# Patient Record
Sex: Female | Born: 1935 | Race: Black or African American | Hispanic: No | Marital: Single | Smoking: Never smoker
Health system: Southern US, Community
[De-identification: ages and names within clinical notes are randomized; demographics above are authoritative.]

## PROBLEM LIST (undated history)

## (undated) ENCOUNTER — Emergency Department (HOSPITAL_COMMUNITY): Admission: EM | Payer: Medicare Other

## (undated) DIAGNOSIS — R600 Localized edema: Secondary | ICD-10-CM

## (undated) DIAGNOSIS — I10 Essential (primary) hypertension: Secondary | ICD-10-CM

## (undated) DIAGNOSIS — M1990 Unspecified osteoarthritis, unspecified site: Secondary | ICD-10-CM

## (undated) DIAGNOSIS — I499 Cardiac arrhythmia, unspecified: Secondary | ICD-10-CM

## (undated) DIAGNOSIS — H409 Unspecified glaucoma: Secondary | ICD-10-CM

## (undated) DIAGNOSIS — K279 Peptic ulcer, site unspecified, unspecified as acute or chronic, without hemorrhage or perforation: Secondary | ICD-10-CM

## (undated) DIAGNOSIS — M792 Neuralgia and neuritis, unspecified: Secondary | ICD-10-CM

## (undated) HISTORY — PX: APPENDECTOMY: SHX54

## (undated) HISTORY — DX: Unspecified glaucoma: H40.9

## (undated) HISTORY — DX: Unspecified osteoarthritis, unspecified site: M19.90

## (undated) HISTORY — DX: Localized edema: R60.0

## (undated) HISTORY — PX: VESICOVAGINAL FISTULA CLOSURE W/ TAH: SUR271

## (undated) HISTORY — DX: Neuralgia and neuritis, unspecified: M79.2

## (undated) HISTORY — DX: Peptic ulcer, site unspecified, unspecified as acute or chronic, without hemorrhage or perforation: K27.9

---

## 2001-07-14 ENCOUNTER — Encounter: Admission: RE | Admit: 2001-07-14 | Discharge: 2001-07-14 | Payer: Self-pay | Admitting: Orthopedic Surgery

## 2001-07-14 ENCOUNTER — Encounter: Payer: Self-pay | Admitting: Orthopedic Surgery

## 2001-07-28 ENCOUNTER — Encounter: Admission: RE | Admit: 2001-07-28 | Discharge: 2001-07-28 | Payer: Self-pay | Admitting: Orthopedic Surgery

## 2001-07-28 ENCOUNTER — Encounter: Payer: Self-pay | Admitting: Orthopedic Surgery

## 2001-08-11 ENCOUNTER — Encounter: Admission: RE | Admit: 2001-08-11 | Discharge: 2001-08-11 | Payer: Self-pay | Admitting: Orthopedic Surgery

## 2001-08-11 ENCOUNTER — Encounter: Payer: Self-pay | Admitting: Orthopedic Surgery

## 2002-03-29 ENCOUNTER — Encounter: Admission: RE | Admit: 2002-03-29 | Discharge: 2002-03-29 | Payer: Self-pay | Admitting: Orthopedic Surgery

## 2002-03-29 ENCOUNTER — Encounter: Payer: Self-pay | Admitting: Orthopedic Surgery

## 2002-04-13 ENCOUNTER — Encounter: Payer: Self-pay | Admitting: Orthopedic Surgery

## 2002-04-13 ENCOUNTER — Encounter: Admission: RE | Admit: 2002-04-13 | Discharge: 2002-04-13 | Payer: Self-pay | Admitting: Orthopedic Surgery

## 2002-05-04 ENCOUNTER — Encounter: Admission: RE | Admit: 2002-05-04 | Discharge: 2002-05-04 | Payer: Self-pay | Admitting: Orthopedic Surgery

## 2002-05-04 ENCOUNTER — Encounter: Payer: Self-pay | Admitting: Orthopedic Surgery

## 2003-02-15 ENCOUNTER — Encounter: Payer: Self-pay | Admitting: Diagnostic Radiology

## 2003-02-15 ENCOUNTER — Encounter: Payer: Self-pay | Admitting: Orthopedic Surgery

## 2003-02-15 ENCOUNTER — Encounter: Admission: RE | Admit: 2003-02-15 | Discharge: 2003-02-15 | Payer: Self-pay | Admitting: Orthopedic Surgery

## 2003-03-08 ENCOUNTER — Encounter: Admission: RE | Admit: 2003-03-08 | Discharge: 2003-03-08 | Payer: Self-pay | Admitting: Orthopedic Surgery

## 2003-03-08 ENCOUNTER — Encounter: Payer: Self-pay | Admitting: Orthopedic Surgery

## 2003-03-29 ENCOUNTER — Encounter: Payer: Self-pay | Admitting: Orthopedic Surgery

## 2003-03-29 ENCOUNTER — Encounter: Admission: RE | Admit: 2003-03-29 | Discharge: 2003-03-29 | Payer: Self-pay | Admitting: Orthopedic Surgery

## 2003-10-04 ENCOUNTER — Encounter: Admission: RE | Admit: 2003-10-04 | Discharge: 2003-10-04 | Payer: Self-pay | Admitting: Family Medicine

## 2004-02-11 ENCOUNTER — Emergency Department (HOSPITAL_COMMUNITY): Admission: EM | Admit: 2004-02-11 | Discharge: 2004-02-11 | Payer: Self-pay | Admitting: Emergency Medicine

## 2005-05-12 ENCOUNTER — Emergency Department (HOSPITAL_COMMUNITY): Admission: EM | Admit: 2005-05-12 | Discharge: 2005-05-12 | Payer: Self-pay | Admitting: Emergency Medicine

## 2005-05-20 ENCOUNTER — Encounter: Admission: RE | Admit: 2005-05-20 | Discharge: 2005-07-07 | Payer: Self-pay | Admitting: Family Medicine

## 2007-02-24 ENCOUNTER — Encounter: Admission: RE | Admit: 2007-02-24 | Discharge: 2007-02-24 | Payer: Self-pay | Admitting: Family Medicine

## 2007-03-08 ENCOUNTER — Ambulatory Visit: Payer: Self-pay | Admitting: *Deleted

## 2007-03-08 ENCOUNTER — Ambulatory Visit (HOSPITAL_COMMUNITY): Admission: RE | Admit: 2007-03-08 | Discharge: 2007-03-08 | Payer: Self-pay | Admitting: Orthopedic Surgery

## 2008-11-11 ENCOUNTER — Emergency Department (HOSPITAL_COMMUNITY): Admission: EM | Admit: 2008-11-11 | Discharge: 2008-11-11 | Payer: Self-pay | Admitting: Family Medicine

## 2009-04-23 ENCOUNTER — Encounter: Admission: RE | Admit: 2009-04-23 | Discharge: 2009-04-23 | Payer: Self-pay | Admitting: Family Medicine

## 2009-05-14 ENCOUNTER — Encounter: Admission: RE | Admit: 2009-05-14 | Discharge: 2009-05-14 | Payer: Self-pay | Admitting: Family Medicine

## 2010-09-24 ENCOUNTER — Encounter: Admission: RE | Admit: 2010-09-24 | Discharge: 2010-09-24 | Payer: Self-pay | Admitting: Family Medicine

## 2013-09-10 ENCOUNTER — Encounter: Payer: Self-pay | Admitting: Cardiology

## 2013-09-11 ENCOUNTER — Ambulatory Visit: Payer: Self-pay | Admitting: Interventional Cardiology

## 2013-09-11 ENCOUNTER — Telehealth: Payer: Self-pay

## 2013-09-11 DIAGNOSIS — I4891 Unspecified atrial fibrillation: Secondary | ICD-10-CM | POA: Insufficient documentation

## 2013-09-11 DIAGNOSIS — I10 Essential (primary) hypertension: Secondary | ICD-10-CM | POA: Insufficient documentation

## 2013-09-11 NOTE — Telephone Encounter (Signed)
pt daughter notified that appt today will be cancelled per Dr.Smith. pt just , needs to be seen as needed. pt sts pt is not having any cardiac symptoms, pt dtr Bonita Quin will call back to sch an appt if needed.

## 2013-10-24 ENCOUNTER — Emergency Department (HOSPITAL_COMMUNITY)
Admission: EM | Admit: 2013-10-24 | Discharge: 2013-10-24 | Disposition: A | Discharge status: Home / Self Care | Payer: Medicare Other | Source: Home / Self Care | Attending: Emergency Medicine | Admitting: Emergency Medicine

## 2013-10-24 ENCOUNTER — Encounter (HOSPITAL_COMMUNITY): Payer: Self-pay | Admitting: Emergency Medicine

## 2013-10-24 DIAGNOSIS — X58XXXA Exposure to other specified factors, initial encounter: Secondary | ICD-10-CM

## 2013-10-24 DIAGNOSIS — T148XXA Other injury of unspecified body region, initial encounter: Secondary | ICD-10-CM

## 2013-10-24 MED ORDER — SILVER SULFADIAZINE 1 % EX CREA: 1.0000 "application " | TOPICAL_CREAM | Freq: Every day | CUTANEOUS | Status: DC

## 2013-10-24 MED ORDER — DOXYCYCLINE HYCLATE 100 MG PO TABS: 100.0000 mg | ORAL_TABLET | Freq: Two times a day (BID) | ORAL | Status: DC

## 2013-10-24 NOTE — ED Provider Notes (Signed)
  Chief Complaint    Chief Complaint  Patient presents with  . 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 She is now retired and he is taking care of her, even though he is 6 months older than she is. She has had a three-month history of recurring blisters on both lower legs. This has been accompanied by edema. Dr. Bruna Potter has been treating her with furosemide for the edema. He has given her rounds of doxycycline when she has a blister. These are painless and they usually heal up well with some scarring. The current blister has been going on since yesterday on the right pretibial surface. She denies any trauma or injury. No other skin rash.  Review of Systems   Other than as noted above, the patient denies any of the following symptoms: Systemic:  No fever, chills, or myalgias. ENT:  No nasal congestion, rhinorrhea, sore throat, swelling of lips, tongue or throat. Resp:  No cough, wheezing, or shortness of breath.  PMFSH    Past medical history, family history, social history, meds, and allergies were reviewed. She has glaucoma, osteoporosis, and hypertension. Current meds include aspirin, hydrocodone, ibuprofen, meloxicam, Travatan eyedrops, and valsartan.  Physical Exam     Vital signs:  BP 133/78  Pulse 98  Temp(Src) 97.2 F (36.2 C) (Oral)  Resp 19  SpO2 100% Gen:  Alert, oriented, in no distress. ENT:  Pharynx clear, no intraoral lesions, moist mucous membranes. Lungs:  Clear to auscultation. Skin:  There is a large, 3.5 x 3.5 cm blister on the right pretibial surface. This is tense. She has pitting edema both pretibial areas. Pedal pulses were not felt. There is also small ulceration the tip of her left great toe. Skin was otherwise clear.  Course in Urgent Care Center     The blister was prepped with alcohol and then snipped open, the dead skin was snipped  off, Silvadene dressing was applied, followed by Telfa, Kerlix, and Coban. Her caretakers with her today and she was instructed in wound care.  Assessment    The encounter diagnosis was Blister.  This could be just due to the edema and stasis, pemphigus or pemphigoid is also another possibility.  Plan     1.  Meds:  The following meds were prescribed:   Discharge Medication List as of 10/24/2013  2:25 PM    START taking these medications   Details  doxycycline (VIBRA-TABS) 100 MG tablet Take 1 tablet (100 mg total) by mouth 2 (two) times daily., Starting 10/24/2013, Until Discontinued, Normal    silver sulfADIAZINE (SILVADENE) 1 % cream Apply 1 application topically daily., Starting 10/24/2013, Until Discontinued, Normal        2.  Patient Education/Counseling:  The patient was given appropriate handouts, self care instructions, and instructed in symptomatic relief.  The patient and caretaker were instructed in wound care.  3.  Follow up:  The patient was told to follow up here if no better in 3 to 4 days, or sooner if becoming worse in any way, and given some red flag symptoms such as fever which would prompt immediate return.  Follow up with Dr. Bruna Potter in one week.      Reuben Likes, MD 10/24/13 [phone]

## 2013-10-24 NOTE — ED Notes (Signed)
Large blister right lower leg.  Patient reports 2 month history of this blister

## 2013-12-30 ENCOUNTER — Emergency Department (HOSPITAL_COMMUNITY)
Admission: EM | Admit: 2013-12-30 | Discharge: 2013-12-30 | Disposition: A | Discharge status: Home / Self Care | Payer: Medicare Other | Attending: Emergency Medicine | Admitting: Emergency Medicine

## 2013-12-30 ENCOUNTER — Emergency Department (HOSPITAL_COMMUNITY): Payer: Medicare Other

## 2013-12-30 ENCOUNTER — Encounter (HOSPITAL_COMMUNITY): Payer: Self-pay | Admitting: Emergency Medicine

## 2013-12-30 DIAGNOSIS — D649 Anemia, unspecified: Secondary | ICD-10-CM | POA: Insufficient documentation

## 2013-12-30 DIAGNOSIS — Z79899 Other long term (current) drug therapy: Secondary | ICD-10-CM | POA: Insufficient documentation

## 2013-12-30 DIAGNOSIS — M7989 Other specified soft tissue disorders: Secondary | ICD-10-CM | POA: Insufficient documentation

## 2013-12-30 DIAGNOSIS — R609 Edema, unspecified: Secondary | ICD-10-CM | POA: Insufficient documentation

## 2013-12-30 DIAGNOSIS — Z7982 Long term (current) use of aspirin: Secondary | ICD-10-CM | POA: Insufficient documentation

## 2013-12-30 DIAGNOSIS — H409 Unspecified glaucoma: Secondary | ICD-10-CM | POA: Insufficient documentation

## 2013-12-30 DIAGNOSIS — I10 Essential (primary) hypertension: Secondary | ICD-10-CM | POA: Insufficient documentation

## 2013-12-30 DIAGNOSIS — Z8711 Personal history of peptic ulcer disease: Secondary | ICD-10-CM | POA: Insufficient documentation

## 2013-12-30 DIAGNOSIS — M1990 Unspecified osteoarthritis, unspecified site: Secondary | ICD-10-CM | POA: Insufficient documentation

## 2013-12-30 HISTORY — DX: Essential (primary) hypertension: I10

## 2013-12-30 LAB — CBC WITH DIFFERENTIAL/PLATELET
Basophils Absolute: 0 10*3/uL (ref 0.0–0.1)
Basophils Relative: 0 % (ref 0–1)
Eosinophils Absolute: 0.1 10*3/uL (ref 0.0–0.7)
Eosinophils Relative: 1 % (ref 0–5)
HCT: 30.2 % — ABNORMAL LOW (ref 36.0–46.0)
Hemoglobin: 9.7 g/dL — ABNORMAL LOW (ref 12.0–15.0)
Lymphocytes Relative: 35 % (ref 12–46)
Lymphs Abs: 2.8 10*3/uL (ref 0.7–4.0)
MCH: 27.2 pg (ref 26.0–34.0)
MCHC: 32.1 g/dL (ref 30.0–36.0)
MCV: 84.6 fL (ref 78.0–100.0)
Monocytes Absolute: 0.7 10*3/uL (ref 0.1–1.0)
Monocytes Relative: 9 % (ref 3–12)
Neutro Abs: 4.4 10*3/uL (ref 1.7–7.7)
Neutrophils Relative %: 55 % (ref 43–77)
Platelets: 255 10*3/uL (ref 150–400)
RBC: 3.57 MIL/uL — ABNORMAL LOW (ref 3.87–5.11)
RDW: 16.3 % — ABNORMAL HIGH (ref 11.5–15.5)
WBC: 8 10*3/uL (ref 4.0–10.5)

## 2013-12-30 LAB — BASIC METABOLIC PANEL
BUN: 15 mg/dL (ref 6–23)
CO2: 23 mEq/L (ref 19–32)
Calcium: 9.3 mg/dL (ref 8.4–10.5)
Chloride: 107 mEq/L (ref 96–112)
Creatinine, Ser: 0.58 mg/dL (ref 0.50–1.10)
GFR calc Af Amer: 90 mL/min — ABNORMAL LOW (ref >90)
GFR calc non Af Amer: 78 mL/min — ABNORMAL LOW (ref >90)
Glucose, Bld: 105 mg/dL — ABNORMAL HIGH (ref 70–99)
Potassium: 3.8 mEq/L (ref 3.7–5.3)
Sodium: 144 mEq/L (ref 137–147)

## 2013-12-30 LAB — POC OCCULT BLOOD, ED: Fecal Occult Bld: NEGATIVE

## 2013-12-30 MED ORDER — ENOXAPARIN SODIUM 60 MG/0.6ML ~~LOC~~ SOLN
1.0000 mg/kg | Freq: Two times a day (BID) | SUBCUTANEOUS | Status: DC
  Administered 2013-12-30: 50 mg via SUBCUTANEOUS
  Filled 2013-12-30 (×2): qty 0.6

## 2013-12-30 NOTE — ED Notes (Signed)
Pt from home c/o bilateral lower extremity edema x 1 week. She is currently on Lasix but swelling has gotten worse. Pt denies any pain at this time. Daughter reports pt lives alone and is having trouble walking d/t the swelling and she last ate at 0400 this am. No hx of CHF.

## 2013-12-30 NOTE — ED Provider Notes (Signed)
CSN: [id number]     Arrival date & time 12/30/13  1800 History   First MD Initiated Contact with Patient 12/30/13 1859     Chief Complaint  Patient presents with  . Leg Swelling     (Consider location/radiation/quality/duration/timing/severity/associated sxs/prior Treatment) HPI Pt is a 80yo female with hx of lower extremity edema and HTN, brought in by daughter with c/o of worsening bilateral leg edema x1 week. Pt is currently on Lasix, 40mg  daily but swelling is worsening. Pt denies pain at this time but told daughter twice today she almost fell because her legs were so heavy.  Pt lives alone.  No hx of CHF. Denies recent illness, cough, congestion, chest pain, fever, n/v/d. Denies new injuries. Denies hx of PE or DVT.    Past Medical History  Diagnosis Date  . Peptic ulcer disease   . Osteoarthritis   . Lower extremity edema   . Glaucoma   . Neuralgia     ,optic  . Hypertension    Past Surgical History  Procedure Laterality Date  . Appendectomy    . Vesicovaginal fistula closure w/ tah     No family history on file. History  Substance Use Topics  . Smoking status: Never Smoker   . Smokeless tobacco: Not on file  . Alcohol Use: No   OB History   Grav Para Term Preterm Abortions TAB SAB Ect Mult Living                 Review of Systems  Constitutional: Negative for fever and chills.  HENT: Negative for congestion.   Respiratory: Negative for cough and shortness of breath.   Cardiovascular: Positive for leg swelling. Negative for chest pain and palpitations.  Musculoskeletal: Positive for joint swelling.  Skin: Negative for color change.  All other systems reviewed and are negative.      Allergies  Amiodarone hcl and Celebrex  Home Medications   Current Outpatient Rx  Name  Route  Sig  Dispense  Refill  . aspirin 81 MG tablet   Oral   Take 81 mg by mouth every morning.          Marland Kitchen dextromethorphan (DELSYM) 30 MG/5ML liquid   Oral   Take 30 mg by mouth  as needed for cough.         . ferrous fumarate (HEMOCYTE - 106 MG FE) 325 (106 FE) MG TABS tablet   Oral   Take 1 tablet by mouth every morning.         . furosemide (LASIX) 40 MG tablet   Oral   Take 40 mg by mouth every morning.         Marland Kitchen HYDROcodone-acetaminophen (NORCO/VICODIN) 5-325 MG per tablet   Oral   Take 1 tablet by mouth 2 (two) times daily.          Marland Kitchen ibuprofen (ADVIL,MOTRIN) 200 MG tablet   Oral   Take 200 mg by mouth every 6 (six) hours as needed for moderate pain.          Marland Kitchen travoprost, benzalkonium, (TRAVATAN) 0.004 % ophthalmic solution   Both Eyes   Place 1 drop into both eyes at bedtime.         . valsartan (DIOVAN) 80 MG tablet   Oral   Take 80 mg by mouth every morning.           BP 142/74  Pulse 80  Temp(Src) 98.3 F (36.8 C) (Oral)  Resp 18  Ht  5\' 2"  (1.575 m)  Wt 105 lb (47.628 kg)  BMI 19.20 kg/m2  SpO2 100% Physical Exam  Nursing note and vitals reviewed. Constitutional: She appears well-developed and well-nourished. No distress.  Pt lying comfortably in exam bed, NAD.  HENT:  Head: Normocephalic and atraumatic.  Eyes: Conjunctivae are normal. No scleral icterus.  Neck: Normal range of motion.  Cardiovascular: Normal rate, regular rhythm and normal heart sounds.   Pulmonary/Chest: Effort normal and breath sounds normal. No respiratory distress. She has no wheezes. She has no rales. She exhibits no tenderness.  No respiratory distress, able to speak in full sentences w/o difficulty. Lungs: CTAB  Abdominal: Soft. Bowel sounds are normal. She exhibits no distension and no mass. There is no tenderness. There is no rebound and no guarding.  Musculoskeletal: Normal range of motion. She exhibits edema.  Bilateral pitting edema, 2+ right leg, +2-3 left leg.  Calves-non-tender.   Neurological: She is alert.  Skin: Skin is warm and dry. She is not diaphoretic.  Diffuse dry scaling skin and healing sore on posterior right leg. No  erythema, warmth, or discharge.    ED Course  Procedures (including critical care time) Labs Review Labs Reviewed  CBC WITH DIFFERENTIAL - Abnormal; Notable for the following:    RBC 3.57 (*)    Hemoglobin 9.7 (*)    HCT 30.2 (*)    RDW 16.3 (*)    All other components within normal limits  BASIC METABOLIC PANEL - Abnormal; Notable for the following:    Glucose, Bld 105 (*)    GFR calc non Af Amer 78 (*)    GFR calc Af Amer 90 (*)    All other components within normal limits  POC OCCULT BLOOD, ED   Imaging Review Dg Chest 2 View  12/30/2013   CLINICAL DATA:  Short of breath.  Bilateral lower extremity edema.  EXAM: CHEST  2 VIEW  COMPARISON:  09/24/2010  FINDINGS: The cardiac silhouette is mildly enlarged. Aorta is mildly uncoiled. No mediastinal or hilar masses.  Clear lungs.  No pleural effusion or pneumothorax.  Bony thorax is demineralized but grossly intact.  IMPRESSION: No acute cardiopulmonary disease.   Electronically Signed   By: Amie Portlandavid  Ormond M.D.   On: 12/30/2013 20:18     EKG Interpretation None      MDM   Final diagnoses:  Left leg swelling  Right leg swelling  Anemia    pt with hx of bilateral leg swelling takes 40mg  lasix daily. Swelling worsened over last week.  Denies hx of CHF. Denies chest pain, cough or congestion.  On exam-Vitals: WNL. Lungs-CTAB.  Bilateral leg edema, L>R.   Some concern for DVT, venous duplex unavailable at this time. (Sunday, 7:23PM)  CBC-Hgb-9.7.  Pt states she does have hx of anemia, daughter presented printed lab work indicating Hgb on 11/2013 was 10.1 and on 04/2013 was 9.9.   Low concern for GI bleed, however, pt states stool is darker in color. Likely due to pt taking iron, will get hemoccult.  BMP: unremarkable. CXR: no acute cardiopulmonary disease  Discussed pt with Dr. Loretha StaplerWofford, will tx pt empirically for DVT, give 1st dose Lovenox per pharmacy protocol in ED and have pt [facility].   Discussed plan with pt and daughter who agreed. Return precautions provided. Pt verbalized understanding and agreement with tx plan.     Junius FinnerErin O'Malley, PA-C 12/31/13 0106

## 2013-12-30 NOTE — ED Provider Notes (Signed)
Medical screening examination/treatment/procedure(s) were conducted as a shared visit with non-physician practitioner(s) and myself.  I personally evaluated the patient during the encounter.   EKG Interpretation None      80 yo female with bilateral lower extremity swelling.  Present to some degree for past 6 months, but acutely worse.  No CP, no SOB.  On exam, well appearing, normal perfusion, normal respiratory effort, 1+ pitting edema in RLE, 2+ pitting edema in LLE.  Good distal pulses in bilateral lower extremities.  Do not think swelling is secondary to CHF.  Likely dependent.  However, as edema is now asymmetric, feel that she needs DVT study.  Plan for dose of Lovenox tonight and followup tomorrow for DVT study. She has mild anemia which is stable her lab reports that patient brought with her.   Clinical Impression: 1. Left leg swelling   2. Right leg swelling   3. Anemia       Candyce ChurnJohn David Melannie Metzner III, MD 12/31/13 [phone]

## 2013-12-31 ENCOUNTER — Ambulatory Visit (HOSPITAL_COMMUNITY)
Admission: RE | Admit: 2013-12-31 | Discharge: 2013-12-31 | Disposition: A | Discharge status: Home / Self Care | Payer: Medicare Other | Source: Ambulatory Visit | Attending: Orthopedic Surgery | Admitting: Orthopedic Surgery

## 2013-12-31 DIAGNOSIS — I4891 Unspecified atrial fibrillation: Secondary | ICD-10-CM | POA: Insufficient documentation

## 2013-12-31 DIAGNOSIS — R609 Edema, unspecified: Secondary | ICD-10-CM

## 2013-12-31 NOTE — ED Provider Notes (Signed)
Medical screening examination/treatment/procedure(s) were conducted as a shared visit with non-physician practitioner(s) and myself.  I personally evaluated the patient during the encounter.   EKG Interpretation None        Jaymison Luber David Kunio Cummiskey III, MD 12/31/13 1329 

## 2013-12-31 NOTE — Progress Notes (Addendum)
Bilateral lower extremity venous duplex:  No evidence of DVT or superficial thrombosis.  There appears to be a Baker's Cyst in the left popliteal fossa.   

## 2014-04-16 ENCOUNTER — Telehealth: Payer: Self-pay

## 2014-04-16 MED ORDER — VALSARTAN 80 MG PO TABS
80.0000 mg | ORAL_TABLET | Freq: Every morning | ORAL | Status: DC
Start: 2014-04-16 — End: 2014-06-21

## 2014-04-16 NOTE — Telephone Encounter (Signed)
Refilled Diovan 30 R-1 pt past due for f/u

## 2014-06-12 ENCOUNTER — Encounter (HOSPITAL_COMMUNITY): Payer: Self-pay | Admitting: Emergency Medicine

## 2014-06-12 ENCOUNTER — Inpatient Hospital Stay (HOSPITAL_COMMUNITY)
Admission: EM | Admit: 2014-06-12 | Discharge: 2014-06-25 | DRG: 388 | Disposition: E | Payer: Medicare Other | Attending: Internal Medicine | Admitting: Internal Medicine

## 2014-06-12 ENCOUNTER — Emergency Department (HOSPITAL_COMMUNITY): Payer: Medicare Other

## 2014-06-12 ENCOUNTER — Observation Stay (HOSPITAL_COMMUNITY): Payer: Medicare Other

## 2014-06-12 DIAGNOSIS — D649 Anemia, unspecified: Secondary | ICD-10-CM | POA: Diagnosis present

## 2014-06-12 DIAGNOSIS — Z7982 Long term (current) use of aspirin: Secondary | ICD-10-CM

## 2014-06-12 DIAGNOSIS — I5021 Acute systolic (congestive) heart failure: Secondary | ICD-10-CM | POA: Diagnosis present

## 2014-06-12 DIAGNOSIS — L8992 Pressure ulcer of unspecified site, stage 2: Secondary | ICD-10-CM | POA: Diagnosis present

## 2014-06-12 DIAGNOSIS — I509 Heart failure, unspecified: Secondary | ICD-10-CM | POA: Diagnosis not present

## 2014-06-12 DIAGNOSIS — J9601 Acute respiratory failure with hypoxia: Secondary | ICD-10-CM

## 2014-06-12 DIAGNOSIS — K56609 Unspecified intestinal obstruction, unspecified as to partial versus complete obstruction: Secondary | ICD-10-CM | POA: Diagnosis not present

## 2014-06-12 DIAGNOSIS — L89109 Pressure ulcer of unspecified part of back, unspecified stage: Secondary | ICD-10-CM | POA: Diagnosis present

## 2014-06-12 DIAGNOSIS — Z66 Do not resuscitate: Secondary | ICD-10-CM | POA: Diagnosis present

## 2014-06-12 DIAGNOSIS — R627 Adult failure to thrive: Secondary | ICD-10-CM | POA: Diagnosis present

## 2014-06-12 DIAGNOSIS — I480 Paroxysmal atrial fibrillation: Secondary | ICD-10-CM

## 2014-06-12 DIAGNOSIS — I482 Chronic atrial fibrillation, unspecified: Secondary | ICD-10-CM

## 2014-06-12 DIAGNOSIS — Z515 Encounter for palliative care: Secondary | ICD-10-CM

## 2014-06-12 DIAGNOSIS — R339 Retention of urine, unspecified: Secondary | ICD-10-CM | POA: Diagnosis present

## 2014-06-12 DIAGNOSIS — I4891 Unspecified atrial fibrillation: Secondary | ICD-10-CM | POA: Diagnosis present

## 2014-06-12 DIAGNOSIS — R000 Tachycardia, unspecified: Secondary | ICD-10-CM | POA: Diagnosis present

## 2014-06-12 DIAGNOSIS — I4949 Other premature depolarization: Secondary | ICD-10-CM | POA: Diagnosis not present

## 2014-06-12 DIAGNOSIS — I4819 Other persistent atrial fibrillation: Secondary | ICD-10-CM

## 2014-06-12 DIAGNOSIS — I10 Essential (primary) hypertension: Secondary | ICD-10-CM | POA: Diagnosis present

## 2014-06-12 DIAGNOSIS — M1990 Unspecified osteoarthritis, unspecified site: Secondary | ICD-10-CM | POA: Diagnosis present

## 2014-06-12 DIAGNOSIS — I5023 Acute on chronic systolic (congestive) heart failure: Secondary | ICD-10-CM

## 2014-06-12 DIAGNOSIS — L8993 Pressure ulcer of unspecified site, stage 3: Secondary | ICD-10-CM | POA: Diagnosis present

## 2014-06-12 DIAGNOSIS — E876 Hypokalemia: Secondary | ICD-10-CM | POA: Diagnosis present

## 2014-06-12 DIAGNOSIS — J9600 Acute respiratory failure, unspecified whether with hypoxia or hypercapnia: Secondary | ICD-10-CM | POA: Diagnosis present

## 2014-06-12 HISTORY — DX: Cardiac arrhythmia, unspecified: I49.9

## 2014-06-12 LAB — URINALYSIS, ROUTINE W REFLEX MICROSCOPIC
Glucose, UA: NEGATIVE mg/dL
Hgb urine dipstick: NEGATIVE
KETONES UR: 15 mg/dL — AB
LEUKOCYTES UA: NEGATIVE
NITRITE: NEGATIVE
PROTEIN: 30 mg/dL — AB
Specific Gravity, Urine: 1.018 (ref 1.005–1.030)
UROBILINOGEN UA: 0.2 mg/dL (ref 0.0–1.0)
pH: 5.5 (ref 5.0–8.0)

## 2014-06-12 LAB — COMPREHENSIVE METABOLIC PANEL
ALT: 8 U/L (ref 0–35)
AST: 18 U/L (ref 0–37)
Albumin: 3.4 g/dL — ABNORMAL LOW (ref 3.5–5.2)
Alkaline Phosphatase: 46 U/L (ref 39–117)
Anion gap: 17 — ABNORMAL HIGH (ref 5–15)
BUN: 17 mg/dL (ref 6–23)
CALCIUM: 9.3 mg/dL (ref 8.4–10.5)
CO2: 21 meq/L (ref 19–32)
CREATININE: 0.63 mg/dL (ref 0.50–1.10)
Chloride: 103 mEq/L (ref 96–112)
GFR calc non Af Amer: 75 mL/min — ABNORMAL LOW (ref >90)
GFR, EST AFRICAN AMERICAN: 87 mL/min — AB (ref >90)
GLUCOSE: 188 mg/dL — AB (ref 70–99)
Potassium: 3.8 mEq/L (ref 3.7–5.3)
Sodium: 141 mEq/L (ref 137–147)
Total Bilirubin: 0.5 mg/dL (ref 0.3–1.2)
Total Protein: 6.7 g/dL (ref 6.0–8.3)

## 2014-06-12 LAB — CBC WITH DIFFERENTIAL/PLATELET
Basophils Absolute: 0 K/uL (ref 0.0–0.1)
Basophils Relative: 0 % (ref 0–1)
Eosinophils Absolute: 0 K/uL (ref 0.0–0.7)
Eosinophils Relative: 0 % (ref 0–5)
HCT: 30.3 % — ABNORMAL LOW (ref 36.0–46.0)
Hemoglobin: 10 g/dL — ABNORMAL LOW (ref 12.0–15.0)
Lymphocytes Relative: 9 % — ABNORMAL LOW (ref 12–46)
Lymphs Abs: 0.8 K/uL (ref 0.7–4.0)
MCH: 27.1 pg (ref 26.0–34.0)
MCHC: 33 g/dL (ref 30.0–36.0)
MCV: 82.1 fL (ref 78.0–100.0)
Monocytes Absolute: 0.4 K/uL (ref 0.1–1.0)
Monocytes Relative: 4 % (ref 3–12)
Neutro Abs: 7.7 K/uL (ref 1.7–7.7)
Neutrophils Relative %: 87 % — ABNORMAL HIGH (ref 43–77)
Platelets: 341 K/uL (ref 150–400)
RBC: 3.69 MIL/uL — ABNORMAL LOW (ref 3.87–5.11)
RDW: 15.5 % (ref 11.5–15.5)
WBC: 8.8 K/uL (ref 4.0–10.5)

## 2014-06-12 LAB — PRO B NATRIURETIC PEPTIDE: Pro B Natriuretic peptide (BNP): 1364 pg/mL — ABNORMAL HIGH (ref 0–450)

## 2014-06-12 LAB — URINE MICROSCOPIC-ADD ON

## 2014-06-12 LAB — I-STAT TROPONIN, ED: Troponin i, poc: 0.03 ng/mL (ref 0.00–0.08)

## 2014-06-12 LAB — LIPASE, BLOOD: LIPASE: 13 U/L (ref 11–59)

## 2014-06-12 MED ORDER — IOHEXOL 300 MG/ML  SOLN
80.0000 mL | Freq: Once | INTRAMUSCULAR | Status: AC | PRN
  Administered 2014-06-12: 80 mL via INTRAVENOUS

## 2014-06-12 MED ORDER — DEXTROSE-NACL 5-0.45 % IV SOLN
INTRAVENOUS | Status: DC
Start: 2014-06-13 — End: 2014-06-12

## 2014-06-12 MED ORDER — SODIUM CHLORIDE 0.9 % IV SOLN
1000.0000 mL | Freq: Once | INTRAVENOUS | Status: AC
  Administered 2014-06-12: 1000 mL via INTRAVENOUS

## 2014-06-12 MED ORDER — IOHEXOL 300 MG/ML  SOLN
25.0000 mL | INTRAMUSCULAR | Status: AC
  Administered 2014-06-12 (×2): 25 mL via ORAL

## 2014-06-12 MED ORDER — ONDANSETRON HCL 4 MG/2ML IJ SOLN
4.0000 mg | Freq: Once | INTRAMUSCULAR | Status: AC
  Administered 2014-06-12: 4 mg via INTRAVENOUS
  Filled 2014-06-12: qty 2

## 2014-06-12 MED ORDER — SODIUM CHLORIDE 0.9 % IV SOLN
1000.0000 mL | INTRAVENOUS | Status: DC
  Administered 2014-06-12: 1000 mL via INTRAVENOUS

## 2014-06-12 MED ORDER — MORPHINE SULFATE 2 MG/ML IJ SOLN
2.0000 mg | Freq: Once | INTRAMUSCULAR | Status: AC
  Administered 2014-06-12: 2 mg via INTRAVENOUS
  Filled 2014-06-12: qty 1

## 2014-06-12 NOTE — ED Notes (Signed)
Pt incontinent of urine- will in and out cath pt after fluid bolus complete

## 2014-06-12 NOTE — ED Notes (Addendum)
Pt to ED via [facility] for evaluation of mid abdominal pain onset yesterday.  Pt reports she has not eaten since yesterday because she is not hungry.  Denies N/V/D.  Edema noted to bilateral lower extremities.  LLQ and LUQ tender to palpation.

## 2014-06-12 NOTE — ED Notes (Signed)
Dr.Knapp at bedside  

## 2014-06-12 NOTE — ED Notes (Signed)
Patient transported to CT 

## 2014-06-12 NOTE — ED Provider Notes (Signed)
CSN: [id number]     Arrival date & time 07-03-2014  1527 History   First MD Initiated Contact with Patient 03-Jul-2014 1533     Chief Complaint  Patient presents with  . Abdominal Pain    HPI Patient presents in the emergency room primarily because of progressive weakness and lower extremity edema. Patient has had this issue for a long period of time. At least several months if not longer. The patient's daughter has noted that her mother has become progressively weak. She received really unable to stand now on her own even with assistance. When she tries to stand his legs get very weak and wobbly. patient actually lives at home alone although she does have caregivers that come to the house to assist her. Patient's daughter was trying to get to her to followup with her primary doctor, Dr. Bruna Potter however patient was told the office was closed and they would contact her when the office was open again.   Patient also started complaining of abdominal pain yesterday. It's in the mid abdomen. It does not radiate. It's a vague discomfort. She has not had any trouble with nausea or vomiting or diarrhea. The patient has not had much of an appetite.   Her daughter decided to bring him to the emergency room. She states she was planning on doing this even before she started complaining of abdominal pain.  Past Medical History  Diagnosis Date  . Peptic ulcer disease   . Osteoarthritis   . Lower extremity edema   . Glaucoma   . Neuralgia     ,optic  . Hypertension    Past Surgical History  Procedure Laterality Date  . Appendectomy    . Vesicovaginal fistula closure w/ tah     No family history on file. History  Substance Use Topics  . Smoking status: Never Smoker   . Smokeless tobacco: Not on file  . Alcohol Use: No   OB History   Grav Para Term Preterm Abortions TAB SAB Ect Mult Living                 Review of Systems  All other systems reviewed and are negative.     Allergies  Celebrex and  Amiodarone hcl  Home Medications   Prior to Admission medications   Medication Sig Start Date End Date Taking? Authorizing Provider  alum & mag hydroxide-simeth (MAALOX PLUS) 400-400-40 MG/5ML suspension Take 10 mLs by mouth every 6 (six) hours as needed for indigestion.   Yes Historical Provider, MD  aspirin 81 MG tablet Take 81 mg by mouth every morning.    Yes Historical Provider, MD  dextromethorphan (DELSYM) 30 MG/5ML liquid Take 30 mg by mouth as needed for cough.   Yes Historical Provider, MD  ferrous sulfate 325 (65 FE) MG EC tablet Take 325 mg by mouth daily with breakfast.   Yes Historical Provider, MD  furosemide (LASIX) 80 MG tablet Take 80 mg by mouth daily.   Yes Historical Provider, MD  HYDROcodone-acetaminophen (NORCO/VICODIN) 5-325 MG per tablet Take 1 tablet by mouth 2 (two) times daily.    Yes Historical Provider, MD  ibuprofen (ADVIL,MOTRIN) 200 MG tablet Take 600 mg by mouth 2 (two) times daily as needed for moderate pain.    Yes Historical Provider, MD  lansoprazole (PREVACID) 15 MG capsule Take 15 mg by mouth daily as needed (for heartburn).   Yes Historical Provider, MD  meloxicam (MOBIC) 15 MG tablet Take 15 mg by mouth daily.  Yes Historical Provider, MD  travoprost, benzalkonium, (TRAVATAN) 0.004 % ophthalmic solution Place 1 drop into both eyes at bedtime.   Yes Historical Provider, MD  valsartan (DIOVAN) 80 MG tablet Take 1 tablet (80 mg total) by mouth every morning. 04/16/14  Yes Lyn Records III, MD   BP 118/56  Pulse 92  Temp(Src) 98.2 F (36.8 C) (Oral)  Resp 19  SpO2 94% Physical Exam  Nursing note and vitals reviewed. Constitutional: No distress.  Elderly, frail  HENT:  Head: Normocephalic and atraumatic.  Right Ear: External ear normal.  Left Ear: External ear normal.  Eyes: Conjunctivae are normal. Right eye exhibits no discharge. Left eye exhibits no discharge. No scleral icterus.  Neck: Neck supple. No tracheal deviation present.   Cardiovascular: Normal rate, regular rhythm and intact distal pulses.   Pulmonary/Chest: Effort normal and breath sounds normal. No stridor. No respiratory distress. She has no wheezes. She has no rales.  Abdominal: Soft. Bowel sounds are normal. She exhibits no distension and no mass. There is tenderness. There is no rebound and no guarding.  Mild tenderness to palpation in the left lower quadrant  Musculoskeletal: She exhibits edema. She exhibits no tenderness.  Pitting edema bilateral lower extremities, brawny discoloration of bilateral lower extremities, no ulcerations, no increased warmth  Neurological: She is alert. She has normal strength. No cranial nerve deficit (no facial droop, extraocular movements intact, no slurred speech) or sensory deficit. She exhibits normal muscle tone. She displays no seizure activity. Coordination normal.  Skin: Skin is warm and dry. No rash noted. She is not diaphoretic. There is pallor.  Psychiatric: She has a normal mood and affect.    ED Course  Procedures (including critical care time) Labs Review Labs Reviewed  CBC WITH DIFFERENTIAL - Abnormal; Notable for the following:    RBC 3.69 (*)    Hemoglobin 10.0 (*)    HCT 30.3 (*)    Neutrophils Relative % 87 (*)    Lymphocytes Relative 9 (*)    All other components within normal limits  COMPREHENSIVE METABOLIC PANEL - Abnormal; Notable for the following:    Glucose, Bld 188 (*)    Albumin 3.4 (*)    GFR calc non Af Amer 75 (*)    GFR calc Af Amer 87 (*)    Anion gap 17 (*)    All other components within normal limits  URINALYSIS, ROUTINE W REFLEX MICROSCOPIC - Abnormal; Notable for the following:    Bilirubin Urine SMALL (*)    Ketones, ur 15 (*)    Protein, ur 30 (*)    All other components within normal limits  PRO B NATRIURETIC PEPTIDE - Abnormal; Notable for the following:    Pro B Natriuretic peptide (BNP) 1364.0 (*)    All other components within normal limits  URINE MICROSCOPIC-ADD  ON - Abnormal; Notable for the following:    Casts HYALINE CASTS (*)    All other components within normal limits  LIPASE, BLOOD  I-STAT TROPOININ, ED    Imaging Review Ct Abdomen Pelvis W Contrast  06/13/2014   CLINICAL DATA:  Mid abdominal pain since yesterday.  EXAM: CT ABDOMEN AND PELVIS WITH CONTRAST  TECHNIQUE: Multidetector CT imaging of the abdomen and pelvis was performed using the standard protocol following bolus administration of intravenous contrast.  CONTRAST:  80mL OMNIPAQUE IOHEXOL 300 MG/ML  SOLN  COMPARISON:  None.  FINDINGS: Lung Bases: Patchy airspace disease in the right base posteriorly may be atelectatic, but aspiration and/or  infection could have this appearance. There are tiny bilateral pleural effusions, right greater than left.  Liver:  No focal mass lesion within the liver.  Spleen: No splenomegaly. No focal mass lesion.  Stomach: Small hiatal hernia.  No gastric distention.  Pancreas: Slight prominence of the main pancreatic duct measuring 4 mm diameter in the head of the pancreas. No pancreatic head mass is evident on this study.  Gallbladder/Biliary: Gallbladder is markedly distended with some probable layering tiny calcified stones along the dependent wall. Mild intrahepatic biliary duct dilatation is evident. Extrahepatic common duct measures approximately 6 mm in diameter.  Kidneys/Adrenals: No adrenal nodule or mass. Areas of focal cortical scarring are seen in both kidneys.  Bowel Loops: Duodenum is redundant. Small bowel is diffusely distended measuring up to 3.0 cm in diameter. No evidence for pneumatosis. The colon is decompressed throughout. Diverticular changes are noted in the left colon without diverticulitis.  Nodes: No evidence for lymphadenopathy in the abdomen or pelvis.  Vasculature: Atherosclerotic calcification is noted in the wall of the abdominal aorta without aneurysm. The portal vein and superior mesenteric vein are patent. Splenic vein is patent. The  celiac axis, superior mesenteric artery and inferior mesenteric artery opacified.  Pelvic Genitourinary: Bladder is minimally distended. Uterus is surgically absent. No evidence for adnexal mass.  Bones/Musculoskeletal: Bone windows reveal no worrisome lytic or sclerotic osseous lesions.  Body Wall: There is diffuse body wall edema in the abdomen and pelvis. No evidence for body wall hernia.  Other: Moderate volume intraperitoneal free fluid is associated with mesenteric edema.  IMPRESSION: 1. Diffuse mild small bowel dilatation. Although a distinct transition zone cannot be identified, the terminal ileum appears decompressed as does the colon. Given the colonic decompression, a distal small bowel obstruction is considered more likely based on imaging band diffuse ileus. Again, the etiology of the obstruction is not evident on this scan. Specifically, no evidence for body wall hernia and there is no distinct transition zone evident. 2. Moderate intraperitoneal free fluid with diffuse body wall edema. 3. Distended gallbladder with layering tiny gallstones. No substantial biliary dilatation.   Electronically Signed   By: Kennith CenterEric  Mansell M.D.   On: 06/11/2014 21:45   Dg Abd Acute W/chest  06/05/2014   CLINICAL DATA:  Abdominal pain  EXAM: ACUTE ABDOMEN SERIES (ABDOMEN 2 VIEW & CHEST 1 VIEW)  COMPARISON:  12/30/2013  FINDINGS: Mild cardiac enlargement with normal pulmonary vascularity. Lungs are clear  Dilated small bowel loops of air-fluid levels suggesting proximal small bowel obstruction. No free air. Colon is decompressed.  IMPRESSION: No active cardiopulmonary disease.  Proximal small bowel obstruction.   Electronically Signed   By: Marlan Palauharles  Clark M.D.   On: 06/10/2014 17:07     EKG Interpretation   Date/Time:  Wednesday June 12 2014 15:35:50 EDT Ventricular Rate:  108 PR Interval:  166 QRS Duration: 87 QT Interval:  336 QTC Calculation: 450 R Axis:   34 Text Interpretation:  Sinus tachycardia with  irregular rate Nonspecific T  abnormalities, diffuse leads No significant change since last tracing  Confirmed by Tarvis Blossom  MD-J, Jaziel Bennett ([id number]) on 06/05/2014 3:41:51 PM      MDM   Final diagnoses:  Small bowel obstruction  Congestive heart failure, unspecified congestive heart failure chronicity, unspecified congestive heart failure type    Ct scan shows a bowel obstruction.  No clear etiology as to the cause.  Will consult gen surg and ask hospitalist for admission considering her age and comorbidities.  Linwood Dibbles, MD 2014/07/03 2154

## 2014-06-12 NOTE — ED Notes (Signed)
Patient transported to X-ray 

## 2014-06-13 ENCOUNTER — Inpatient Hospital Stay (HOSPITAL_COMMUNITY): Payer: Medicare Other

## 2014-06-13 ENCOUNTER — Encounter (HOSPITAL_COMMUNITY): Payer: Self-pay | Admitting: *Deleted

## 2014-06-13 DIAGNOSIS — Z7982 Long term (current) use of aspirin: Secondary | ICD-10-CM | POA: Diagnosis not present

## 2014-06-13 DIAGNOSIS — I4891 Unspecified atrial fibrillation: Secondary | ICD-10-CM

## 2014-06-13 DIAGNOSIS — I509 Heart failure, unspecified: Secondary | ICD-10-CM | POA: Diagnosis present

## 2014-06-13 DIAGNOSIS — R339 Retention of urine, unspecified: Secondary | ICD-10-CM | POA: Diagnosis present

## 2014-06-13 DIAGNOSIS — M1990 Unspecified osteoarthritis, unspecified site: Secondary | ICD-10-CM | POA: Diagnosis present

## 2014-06-13 DIAGNOSIS — Z66 Do not resuscitate: Secondary | ICD-10-CM | POA: Diagnosis present

## 2014-06-13 DIAGNOSIS — L8992 Pressure ulcer of unspecified site, stage 2: Secondary | ICD-10-CM | POA: Diagnosis present

## 2014-06-13 DIAGNOSIS — R627 Adult failure to thrive: Secondary | ICD-10-CM | POA: Diagnosis present

## 2014-06-13 DIAGNOSIS — E876 Hypokalemia: Secondary | ICD-10-CM | POA: Diagnosis present

## 2014-06-13 DIAGNOSIS — Z515 Encounter for palliative care: Secondary | ICD-10-CM | POA: Diagnosis not present

## 2014-06-13 DIAGNOSIS — K56609 Unspecified intestinal obstruction, unspecified as to partial versus complete obstruction: Secondary | ICD-10-CM | POA: Diagnosis present

## 2014-06-13 DIAGNOSIS — R000 Tachycardia, unspecified: Secondary | ICD-10-CM | POA: Diagnosis present

## 2014-06-13 DIAGNOSIS — I5021 Acute systolic (congestive) heart failure: Secondary | ICD-10-CM | POA: Diagnosis present

## 2014-06-13 DIAGNOSIS — D649 Anemia, unspecified: Secondary | ICD-10-CM | POA: Diagnosis present

## 2014-06-13 DIAGNOSIS — J9600 Acute respiratory failure, unspecified whether with hypoxia or hypercapnia: Secondary | ICD-10-CM | POA: Diagnosis present

## 2014-06-13 DIAGNOSIS — I10 Essential (primary) hypertension: Secondary | ICD-10-CM

## 2014-06-13 DIAGNOSIS — I4949 Other premature depolarization: Secondary | ICD-10-CM | POA: Diagnosis not present

## 2014-06-13 DIAGNOSIS — L89109 Pressure ulcer of unspecified part of back, unspecified stage: Secondary | ICD-10-CM | POA: Diagnosis present

## 2014-06-13 DIAGNOSIS — L8993 Pressure ulcer of unspecified site, stage 3: Secondary | ICD-10-CM | POA: Diagnosis present

## 2014-06-13 LAB — CBC WITH DIFFERENTIAL/PLATELET
BASOS ABS: 0 10*3/uL (ref 0.0–0.1)
BASOS PCT: 0 % (ref 0–1)
Eosinophils Absolute: 0 10*3/uL (ref 0.0–0.7)
Eosinophils Relative: 0 % (ref 0–5)
HCT: 28.6 % — ABNORMAL LOW (ref 36.0–46.0)
Hemoglobin: 9.6 g/dL — ABNORMAL LOW (ref 12.0–15.0)
LYMPHS PCT: 24 % (ref 12–46)
Lymphs Abs: 1.9 10*3/uL (ref 0.7–4.0)
MCH: 27.6 pg (ref 26.0–34.0)
MCHC: 33.6 g/dL (ref 30.0–36.0)
MCV: 82.2 fL (ref 78.0–100.0)
Monocytes Absolute: 0.8 10*3/uL (ref 0.1–1.0)
Monocytes Relative: 10 % (ref 3–12)
Neutro Abs: 5.3 10*3/uL (ref 1.7–7.7)
Neutrophils Relative %: 66 % (ref 43–77)
PLATELETS: 327 10*3/uL (ref 150–400)
RBC: 3.48 MIL/uL — ABNORMAL LOW (ref 3.87–5.11)
RDW: 15.6 % — AB (ref 11.5–15.5)
WBC: 7.9 10*3/uL (ref 4.0–10.5)

## 2014-06-13 LAB — COMPREHENSIVE METABOLIC PANEL
ALT: 6 U/L (ref 0–35)
AST: 15 U/L (ref 0–37)
Albumin: 2.8 g/dL — ABNORMAL LOW (ref 3.5–5.2)
Alkaline Phosphatase: 38 U/L — ABNORMAL LOW (ref 39–117)
Anion gap: 14 (ref 5–15)
BILIRUBIN TOTAL: 0.4 mg/dL (ref 0.3–1.2)
BUN: 17 mg/dL (ref 6–23)
CHLORIDE: 106 meq/L (ref 96–112)
CO2: 23 meq/L (ref 19–32)
Calcium: 8.5 mg/dL (ref 8.4–10.5)
Creatinine, Ser: 0.67 mg/dL (ref 0.50–1.10)
GFR calc Af Amer: 85 mL/min — ABNORMAL LOW (ref >90)
GFR, EST NON AFRICAN AMERICAN: 73 mL/min — AB (ref >90)
Glucose, Bld: 125 mg/dL — ABNORMAL HIGH (ref 70–99)
Potassium: 3.3 mEq/L — ABNORMAL LOW (ref 3.7–5.3)
Sodium: 143 mEq/L (ref 137–147)
Total Protein: 5.6 g/dL — ABNORMAL LOW (ref 6.0–8.3)

## 2014-06-13 LAB — GLUCOSE, CAPILLARY
GLUCOSE-CAPILLARY: 116 mg/dL — AB (ref 70–99)
Glucose-Capillary: 132 mg/dL — ABNORMAL HIGH (ref 70–99)
Glucose-Capillary: 122 mg/dL — ABNORMAL HIGH (ref 70–99)
Glucose-Capillary: 125 mg/dL — ABNORMAL HIGH (ref 70–99)
Glucose-Capillary: 112 mg/dL — ABNORMAL HIGH (ref 70–99)

## 2014-06-13 LAB — URIC ACID: Uric Acid, Serum: 6.1 mg/dL (ref 2.4–7.0)

## 2014-06-13 MED ORDER — MORPHINE SULFATE 2 MG/ML IJ SOLN
1.0000 mg | INTRAMUSCULAR | Status: DC | PRN
  Administered 2014-06-14 – 2014-06-19 (×3): 1 mg via INTRAVENOUS
  Filled 2014-06-13 (×3): qty 1

## 2014-06-13 MED ORDER — SODIUM CHLORIDE 0.9 % IJ SOLN
3.0000 mL | Freq: Two times a day (BID) | INTRAMUSCULAR | Status: DC
  Administered 2014-06-13 – 2014-06-21 (×10): 3 mL via INTRAVENOUS

## 2014-06-13 MED ORDER — ACETAMINOPHEN 325 MG PO TABS
650.0000 mg | ORAL_TABLET | Freq: Four times a day (QID) | ORAL | Status: DC | PRN
  Administered 2014-06-16: 650 mg via ORAL
  Filled 2014-06-13: qty 2

## 2014-06-13 MED ORDER — SODIUM CHLORIDE 0.9 % IJ SOLN
3.0000 mL | Freq: Two times a day (BID) | INTRAMUSCULAR | Status: DC
  Administered 2014-06-13 – 2014-06-21 (×15): 3 mL via INTRAVENOUS

## 2014-06-13 MED ORDER — ONDANSETRON HCL 4 MG/2ML IJ SOLN: 4.0000 mg | Freq: Four times a day (QID) | INTRAMUSCULAR | Status: DC | PRN

## 2014-06-13 MED ORDER — HYDRALAZINE HCL 20 MG/ML IJ SOLN: 10.0000 mg | INTRAMUSCULAR | Status: DC | PRN

## 2014-06-13 MED ORDER — SODIUM CHLORIDE 0.9 % IV SOLN
INTRAVENOUS | Status: DC
  Administered 2014-06-13: 18:00:00 via INTRAVENOUS

## 2014-06-13 MED ORDER — PANTOPRAZOLE SODIUM 40 MG IV SOLR
40.0000 mg | INTRAVENOUS | Status: DC
  Administered 2014-06-13 – 2014-06-18 (×6): 40 mg via INTRAVENOUS
  Filled 2014-06-13 (×6): qty 40

## 2014-06-13 MED ORDER — CETYLPYRIDINIUM CHLORIDE 0.05 % MT LIQD
7.0000 mL | Freq: Two times a day (BID) | OROMUCOSAL | Status: DC
  Administered 2014-06-14 – 2014-06-18 (×10): 7 mL via OROMUCOSAL

## 2014-06-13 MED ORDER — TRAVOPROST (BAK FREE) 0.004 % OP SOLN
1.0000 [drp] | Freq: Every day | OPHTHALMIC | Status: DC
  Administered 2014-06-13 – 2014-06-18 (×7): 1 [drp] via OPHTHALMIC
  Filled 2014-06-13 (×3): qty 2.5

## 2014-06-13 MED ORDER — ONDANSETRON HCL 4 MG PO TABS
4.0000 mg | ORAL_TABLET | Freq: Four times a day (QID) | ORAL | Status: DC | PRN
  Administered 2014-06-18: 4 mg via ORAL
  Filled 2014-06-13: qty 1

## 2014-06-13 MED ORDER — ACETAMINOPHEN 650 MG RE SUPP: 650.0000 mg | Freq: Four times a day (QID) | RECTAL | Status: DC | PRN

## 2014-06-13 MED ORDER — ENOXAPARIN SODIUM 30 MG/0.3ML ~~LOC~~ SOLN
30.0000 mg | SUBCUTANEOUS | Status: DC
  Administered 2014-06-13 – 2014-06-18 (×6): 30 mg via SUBCUTANEOUS
  Filled 2014-06-13 (×7): qty 0.3

## 2014-06-13 NOTE — Consult Note (Signed)
Reason for Consult:Small bowel obstruction Referring Physician: Shalece Staffa is an 80 y.o. female.  HPI: The patient has had 24 hours of abdominal pain in the central portion of her abdomen associated with nausea and one episode of vomiting. She's had no prior episodes. She is generally alert and cognitively appropriate, but recently has not been able to ambulate very much at all. Her last bowel movement was just over 24 hours ago. Abdominal films and the CT scan of the abdomen and pelvis demonstrated what appears to be a moderate to high-grade small bowel obstruction. A surgical consultation was requested.  Past Medical History  Diagnosis Date  . Peptic ulcer disease   . Osteoarthritis   . Lower extremity edema   . Glaucoma   . Neuralgia     ,optic  . Hypertension   . Dysrhythmia     Past Surgical History  Procedure Laterality Date  . Appendectomy    . Vesicovaginal fistula closure w/ tah      Family History  Problem Relation Age of Onset  . Breast cancer Sister   . CAD Neg Hx   . Diabetes Mellitus II Neg Hx     Social History:  reports that she has never smoked. She does not have any smokeless tobacco history on file. She reports that she does not drink alcohol or use illicit drugs.  Allergies:  Allergies  Allergen Reactions  . Celebrex [Celecoxib] Other (See Comments)    Myalgia  . Amiodarone Hcl [Amiodarone] Cough    Medications: I have reviewed the patient's current medications.  Results for orders placed during the hospital encounter of 06/06/2014 (from the past 48 hour(s))  CBC WITH DIFFERENTIAL     Status: Abnormal   Collection Time    06/14/2014  3:47 PM      Result Value Ref Range   WBC 8.8  4.0 - 10.5 K/uL   RBC 3.69 (*) 3.87 - 5.11 MIL/uL   Hemoglobin 10.0 (*) 12.0 - 15.0 g/dL   HCT 30.3 (*) 36.0 - 46.0 %   MCV 82.1  78.0 - 100.0 fL   MCH 27.1  26.0 - 34.0 pg   MCHC 33.0  30.0 - 36.0 g/dL   RDW 15.5  11.5 - 15.5 %   Platelets 341  150  - 400 K/uL   Neutrophils Relative % 87 (*) 43 - 77 %   Neutro Abs 7.7  1.7 - 7.7 K/uL   Lymphocytes Relative 9 (*) 12 - 46 %   Lymphs Abs 0.8  0.7 - 4.0 K/uL   Monocytes Relative 4  3 - 12 %   Monocytes Absolute 0.4  0.1 - 1.0 K/uL   Eosinophils Relative 0  0 - 5 %   Eosinophils Absolute 0.0  0.0 - 0.7 K/uL   Basophils Relative 0  0 - 1 %   Basophils Absolute 0.0  0.0 - 0.1 K/uL  COMPREHENSIVE METABOLIC PANEL     Status: Abnormal   Collection Time    06/08/2014  3:47 PM      Result Value Ref Range   Sodium 141  137 - 147 mEq/L   Potassium 3.8  3.7 - 5.3 mEq/L   Chloride 103  96 - 112 mEq/L   CO2 21  19 - 32 mEq/L   Glucose, Bld 188 (*) 70 - 99 mg/dL   BUN 17  6 - 23 mg/dL   Creatinine, Ser 0.63  0.50 - 1.10 mg/dL   Calcium 9.3  8.4 - 10.5 mg/dL   Total Protein 6.7  6.0 - 8.3 g/dL   Albumin 3.4 (*) 3.5 - 5.2 g/dL   AST 18  0 - 37 U/L   Comment: HEMOLYSIS AT THIS LEVEL MAY AFFECT RESULT   ALT 8  0 - 35 U/L   Alkaline Phosphatase 46  39 - 117 U/L   Total Bilirubin 0.5  0.3 - 1.2 mg/dL   GFR calc non Af Amer 75 (*) >90 mL/min   GFR calc Af Amer 87 (*) >90 mL/min   Comment: (NOTE)     The eGFR has been calculated using the CKD EPI equation.     This calculation has not been validated in all clinical situations.     eGFR's persistently <90 mL/min signify possible Chronic Kidney     Disease.   Anion gap 17 (*) 5 - 15  LIPASE, BLOOD     Status: None   Collection Time    05/30/2014  3:47 PM      Result Value Ref Range   Lipase 13  11 - 59 U/L  PRO B NATRIURETIC PEPTIDE     Status: Abnormal   Collection Time    06/16/2014  3:47 PM      Result Value Ref Range   Pro B Natriuretic peptide (BNP) 1364.0 (*) 0 - 450 pg/mL  I-STAT TROPOININ, ED     Status: None   Collection Time    06/09/2014  4:16 PM      Result Value Ref Range   Troponin i, poc 0.03  0.00 - 0.08 ng/mL   Comment 3            Comment: Due to the release kinetics of cTnI,     a negative result within the first hours      of the onset of symptoms does not rule out     myocardial infarction with certainty.     If myocardial infarction is still suspected,     repeat the test at appropriate intervals.  URINALYSIS, ROUTINE W REFLEX MICROSCOPIC     Status: Abnormal   Collection Time    05/27/2014  5:56 PM      Result Value Ref Range   Color, Urine YELLOW  YELLOW   APPearance CLEAR  CLEAR   Specific Gravity, Urine 1.018  1.005 - 1.030   pH 5.5  5.0 - 8.0   Glucose, UA NEGATIVE  NEGATIVE mg/dL   Hgb urine dipstick NEGATIVE  NEGATIVE   Bilirubin Urine SMALL (*) NEGATIVE   Ketones, ur 15 (*) NEGATIVE mg/dL   Protein, ur 30 (*) NEGATIVE mg/dL   Urobilinogen, UA 0.2  0.0 - 1.0 mg/dL   Nitrite NEGATIVE  NEGATIVE   Leukocytes, UA NEGATIVE  NEGATIVE  URINE MICROSCOPIC-ADD ON     Status: Abnormal   Collection Time    06/17/2014  5:56 PM      Result Value Ref Range   WBC, UA 3-6  <3 WBC/hpf   RBC / HPF 0-2  <3 RBC/hpf   Bacteria, UA RARE  RARE   Casts HYALINE CASTS (*) NEGATIVE   Urine-Other AMORPHOUS URATES/PHOSPHATES    GLUCOSE, CAPILLARY     Status: Abnormal   Collection Time    06/13/14  4:31 AM      Result Value Ref Range   Glucose-Capillary 132 (*) 70 - 99 mg/dL    Ct Head Wo Contrast  06/13/2014   CLINICAL DATA:  Weakness.  EXAM: CT HEAD WITHOUT  CONTRAST  TECHNIQUE: Contiguous axial images were obtained from the base of the skull through the vertex without intravenous contrast.  COMPARISON:  None.  FINDINGS: Stable age related cerebral atrophy, ventriculomegaly and periventricular white matter disease. No extra-axial fluid collections are identified. No CT findings for acute hemispheric infarction or intracranial hemorrhage. No mass lesions. The brainstem and cerebellum are normal.  The bony structures are intact. No skull fracture. The paranasal sinuses and mastoid air cells are clear. The globes are intact.  IMPRESSION: Age related cerebral atrophy, ventriculomegaly and periventricular white matter disease.   No acute intracranial findings or mass lesion.   Electronically Signed   By: Kalman Jewels M.D.   On: 06/13/2014 00:05   Ct Abdomen Pelvis W Contrast  06/11/2014   CLINICAL DATA:  Mid abdominal pain since yesterday.  EXAM: CT ABDOMEN AND PELVIS WITH CONTRAST  TECHNIQUE: Multidetector CT imaging of the abdomen and pelvis was performed using the standard protocol following bolus administration of intravenous contrast.  CONTRAST:  11m OMNIPAQUE IOHEXOL 300 MG/ML  SOLN  COMPARISON:  None.  FINDINGS: Lung Bases: Patchy airspace disease in the right base posteriorly may be atelectatic, but aspiration and/or infection could have this appearance. There are tiny bilateral pleural effusions, right greater than left.  Liver:  No focal mass lesion within the liver.  Spleen: No splenomegaly. No focal mass lesion.  Stomach: Small hiatal hernia.  No gastric distention.  Pancreas: Slight prominence of the main pancreatic duct measuring 4 mm diameter in the head of the pancreas. No pancreatic head mass is evident on this study.  Gallbladder/Biliary: Gallbladder is markedly distended with some probable layering tiny calcified stones along the dependent wall. Mild intrahepatic biliary duct dilatation is evident. Extrahepatic common duct measures approximately 6 mm in diameter.  Kidneys/Adrenals: No adrenal nodule or mass. Areas of focal cortical scarring are seen in both kidneys.  Bowel Loops: Duodenum is redundant. Small bowel is diffusely distended measuring up to 3.0 cm in diameter. No evidence for pneumatosis. The colon is decompressed throughout. Diverticular changes are noted in the left colon without diverticulitis.  Nodes: No evidence for lymphadenopathy in the abdomen or pelvis.  Vasculature: Atherosclerotic calcification is noted in the wall of the abdominal aorta without aneurysm. The portal vein and superior mesenteric vein are patent. Splenic vein is patent. The celiac axis, superior mesenteric artery and inferior  mesenteric artery opacified.  Pelvic Genitourinary: Bladder is minimally distended. Uterus is surgically absent. No evidence for adnexal mass.  Bones/Musculoskeletal: Bone windows reveal no worrisome lytic or sclerotic osseous lesions.  Body Wall: There is diffuse body wall edema in the abdomen and pelvis. No evidence for body wall hernia.  Other: Moderate volume intraperitoneal free fluid is associated with mesenteric edema.  IMPRESSION: 1. Diffuse mild small bowel dilatation. Although a distinct transition zone cannot be identified, the terminal ileum appears decompressed as does the colon. Given the colonic decompression, a distal small bowel obstruction is considered more likely based on imaging band diffuse ileus. Again, the etiology of the obstruction is not evident on this scan. Specifically, no evidence for body wall hernia and there is no distinct transition zone evident. 2. Moderate intraperitoneal free fluid with diffuse body wall edema. 3. Distended gallbladder with layering tiny gallstones. No substantial biliary dilatation.   Electronically Signed   By: EMisty StanleyM.D.   On: 06/07/2014 21:45   Dg Abd Acute W/chest  06/07/2014   CLINICAL DATA:  Abdominal pain  EXAM: ACUTE ABDOMEN SERIES (ABDOMEN 2  VIEW & CHEST 1 VIEW)  COMPARISON:  12/30/2013  FINDINGS: Mild cardiac enlargement with normal pulmonary vascularity. Lungs are clear  Dilated small bowel loops of air-fluid levels suggesting proximal small bowel obstruction. No free air. Colon is decompressed.  IMPRESSION: No active cardiopulmonary disease.  Proximal small bowel obstruction.   Electronically Signed   By: Franchot Gallo M.D.   On: 06/09/2014 17:07    Review of Systems  Constitutional: Negative for fever and chills.  Eyes: Negative.   Cardiovascular: Negative.   Gastrointestinal: Positive for nausea, vomiting and abdominal pain.  Musculoskeletal: Positive for joint pain and myalgias.  Neurological: Positive for weakness.  All  other systems reviewed and are negative.  Blood pressure 128/46, pulse 87, temperature 98.6 F (37 C), temperature source Oral, resp. rate 18, height _0  (1.575 m), weight 56.7 kg (125 lb), SpO2 100.00%. Physical Exam  Constitutional: She is oriented to person, place, and time. She appears well-developed and well-nourished.  HENT:  Head: Normocephalic and atraumatic.  Eyes: Conjunctivae and EOM are normal. Pupils are equal, round, and reactive to light.  Neck: Normal range of motion. Neck supple.  Cardiovascular: Normal rate and normal heart sounds.  An irregularly irregular rhythm present.  Respiratory: Effort normal and breath sounds normal.  GI: Soft. Bowel sounds are decreased. There is no tenderness. There is no rigidity, no rebound and no guarding.  Musculoskeletal: She exhibits no tenderness.  Seems to have contracted upper extremities, but will move somewhat  Neurological: She is alert and oriented to person, place, and time.  Very sharp and appropriate  Skin: Skin is warm and dry.  Psychiatric: She has a normal mood and affect. Her behavior is normal. Judgment and thought content normal.    Assessment/Plan: SBO with other medical problems. Not anticoagulated. No NGT in place, symptomatically improved.  Continue NPO until bowel activity improves. Repeat plain X-rays today and compare for improvement.  If worse or not imporved, may need NGT  Valgene Deloatch O 06/13/2014, 5:44 AM

## 2014-06-13 NOTE — Progress Notes (Signed)
INITIAL NUTRITION ASSESSMENT  DOCUMENTATION CODES Per approved criteria  -Not Applicable   INTERVENTION: Diet advancement per MD Recommend providing Resource Breeze po TID, each supplement provides 250 kcal and 9 grams of protein Recommend providing 30 ml Pro-stat BID when diet advanced, each supplement provides 100 kcal and 15 grams of protein.  NUTRITION DIAGNOSIS: Inadequate oral intake related to SBO as evidenced by N/V PTA and current NPO status.   Goal: Pt to meet >/= 90% of their estimated nutrition needs   Monitor:  Diet advancement, PO intake, weight trend, labs  Reason for Assessment: Consult (SBO, decreased appetite, and pressure ulcer on sacrum)  80 y.o. female  Admitting Dx: Small bowel obstruction  ASSESSMENT: 80 y.o. female with history of atrial fibrillation not on anticoagulants, hypertension, CHF, arthritis was brought to the ER after patient was having nausea vomiting and abdominal pain and weakness. As per the patient's daughter patient has not been doing well since 2 days. Patient over the last one month has been increasingly weak with increasing pain in her knees from worsening arthritis. Patient also has been having increasing lower extremity edema and her primary care physician had just increased her Lasix dose to 80 mg 2 weeks ago.  Pt remains NPO. Pt asleep at time of visit x2 attempts. Noticeable muscle wasting of temples, clavicles, and acromion. Per weight history pt has not had recent weight loss; however, pt with +3 edema.   Height: Ht Readings from Last 1 Encounters:  06/11/2014 5\' 2"  (1.575 m)    Weight: Wt Readings from Last 1 Encounters:  06/13/14 125 lb (56.7 kg)    Ideal Body Weight: 110 lbs  % Ideal Body Weight: 114%  Wt Readings from Last 10 Encounters:  06/13/14 125 lb (56.7 kg)  12/30/13 105 lb (47.628 kg)    Usual Body Weight: unknown  % Usual Body Weight: NA  BMI:  Body mass index is 22.86 kg/(m^2).  Estimated  Nutritional Needs: Kcal: 1400-1600 Protein: 70-80 grams Fluid: 1.4-1.6 L/day  Skin: stage II pressure ulcer on sacrum; +3 RLE and LLE edema  Diet Order: NPO  EDUCATION NEEDS: -No education needs identified at this time   Intake/Output Summary (Last 24 hours) at 06/13/14 1343 Last data filed at 06/13/14 0556  Gross per 24 hour  Intake      0 ml  Output      0 ml  Net      0 ml    Last BM: 8/19  Labs:   Recent Labs Lab 06/04/2014 1547 06/13/14 0503  NA 141 143  K 3.8 3.3*  CL 103 106  CO2 21 23  BUN 17 17  CREATININE 0.63 0.67  CALCIUM 9.3 8.5  GLUCOSE 188* 125*    CBG (last 3)   Recent Labs  06/13/14 0431 06/13/14 0819 06/13/14 1136  GLUCAP 132* 122* 125*    Scheduled Meds: . [START ON 06/14/2014] antiseptic oral rinse  7 mL Mouth Rinse BID  . enoxaparin (LOVENOX) injection  30 mg Subcutaneous Q24H  . pantoprazole (PROTONIX) IV  40 mg Intravenous Q24H  . sodium chloride  3 mL Intravenous Q12H  . sodium chloride  3 mL Intravenous Q12H  . Travoprost (BAK Free)  1 drop Both Eyes QHS    Continuous Infusions:   Past Medical History  Diagnosis Date  . Peptic ulcer disease   . Osteoarthritis   . Lower extremity edema   . Glaucoma   . Neuralgia     ,optic  .  Hypertension   . Dysrhythmia     Past Surgical History  Procedure Laterality Date  . Appendectomy    . Vesicovaginal fistula closure w/ tah      Ian Malkin RD, LDN Inpatient Clinical Dietitian Pager: [phone] After Hours Pager: [phone]

## 2014-06-13 NOTE — Progress Notes (Signed)
Patient seen and examined. 80 year old lady came in for abdominal pain, was found to have SBO. She is currently NPO and surgery consulted. Recommendations are to manage conservatively.   NOtified by RN, she has urinary retention, with bladder scan of > 350 ml urine. In and out cath once and send UA. Start patient on gentle hydration.   Paula ModyVijaya Faye Sanfilippo, MD [phone]

## 2014-06-13 NOTE — H&P (Signed)
[facility] History and Physical  Paula Nash ZOX:[id number] DOB: 16-May-1919 DOA: 05/30/2014  Referring physician: ER physician. PCP: Burtis Junes, MD   Chief Complaint: Weakness and abdominal pain.  HPI: Paula Nash is a 80 y.o. female with history of atrial fibrillation not on anticoagulants, hypertension, CHF, arthritis was brought to the ER after patient was having nausea vomiting and abdominal pain and weakness. As per the patient's daughter patient has not been doing well since 2 days. Patient over the last one month has been increasingly weak with increasing pain in her knees from worsening arthritis. Patient also has been having increasing lower extremity edema and her primary care physician had just increased her Lasix dose to 80 mg 2 weeks ago. Patient has been feeling difficult to walk due to pain in both her knees which has been ongoing for a long time but has worsened recently. Earlier yesterday morning patient started having nausea vomiting and lower abdominal pain. In the ER CT of the abdomen and pelvis shows features concerning for small bowel obstruction. Patient will be admitted for further management. Patient's weakness is generalized and nonfocal and CT head was negative for anything acute. On exam patient does have significant lower extremity edema and difficult to move her both knees due to pain. Patient usually lives along at home with caregiver.  Review of Systems: As presented in the history of presenting illness, rest negative.  Past Medical History  Diagnosis Date  . Peptic ulcer disease   . Osteoarthritis   . Lower extremity edema   . Glaucoma   . Neuralgia     ,optic  . Hypertension   . Dysrhythmia    Past Surgical History  Procedure Laterality Date  . Appendectomy    . Vesicovaginal fistula closure w/ tah     Social History:  reports that she has never smoked. She does not have any smokeless tobacco history on file. She reports  that she does not drink alcohol or use illicit drugs. Where does patient live home. Can patient participate in ADLs? Not sure.  Allergies  Allergen Reactions  . Celebrex [Celecoxib] Other (See Comments)    Myalgia  . Amiodarone Hcl [Amiodarone] Cough    Family History:  Family History  Problem Relation Age of Onset  . Breast cancer Sister   . CAD Neg Hx   . Diabetes Mellitus II Neg Hx       Prior to Admission medications   Medication Sig Start Date End Date Taking? Authorizing Provider  alum & mag hydroxide-simeth (MAALOX PLUS) 400-400-40 MG/5ML suspension Take 10 mLs by mouth every 6 (six) hours as needed for indigestion.   Yes Historical Provider, MD  aspirin 81 MG tablet Take 81 mg by mouth every morning.    Yes Historical Provider, MD  dextromethorphan (DELSYM) 30 MG/5ML liquid Take 30 mg by mouth as needed for cough.   Yes Historical Provider, MD  ferrous sulfate 325 (65 FE) MG EC tablet Take 325 mg by mouth daily with breakfast.   Yes Historical Provider, MD  furosemide (LASIX) 80 MG tablet Take 80 mg by mouth daily.   Yes Historical Provider, MD  HYDROcodone-acetaminophen (NORCO/VICODIN) 5-325 MG per tablet Take 1 tablet by mouth 2 (two) times daily.    Yes Historical Provider, MD  ibuprofen (ADVIL,MOTRIN) 200 MG tablet Take 600 mg by mouth 2 (two) times daily as needed for moderate pain.    Yes Historical Provider, MD  lansoprazole (PREVACID) 15 MG capsule Take 15 mg  by mouth daily as needed (for heartburn).   Yes Historical Provider, MD  meloxicam (MOBIC) 15 MG tablet Take 15 mg by mouth daily.   Yes Historical Provider, MD  travoprost, benzalkonium, (TRAVATAN) 0.004 % ophthalmic solution Place 1 drop into both eyes at bedtime.   Yes Historical Provider, MD  valsartan (DIOVAN) 80 MG tablet Take 1 tablet (80 mg total) by mouth every morning. 04/16/14  Yes Lesleigh Noe, MD    Physical Exam: Filed Vitals:   05/31/2014 2100 06/05/2014 2216 06/03/2014 2230 06/02/2014 2354  BP:  136/44 123/49 126/55 128/46  Pulse: 87 103 50 87  Temp:    98.6 F (37 C)  TempSrc:      Resp: 15 22 13 18   Height:    5\' 2"  (1.575 m)  Weight:    56.7 kg (125 lb)  SpO2: 96% 95% 97% 100%     General:  Well-built and nourished.  Eyes: Anicteric no pallor.  ENT: No discharge from the ears eyes nose mouth.  Neck: No mass felt.  Cardiovascular: S1-S2 heard. Irregular.  Respiratory: No rhonchi or crepitations.  Abdomen: Soft nontender bowel sounds are appreciated.  Skin: Chronic skin changes.  Musculoskeletal: Pain on moving both knees and significant bilateral lower extremity edema.  Psychiatric: Appears normal.  Neurologic: Alert awake oriented to time place and person. Moves all extremities.  Labs on Admission:  Basic Metabolic Panel:  Recent Labs Lab 06/14/2014 1547  NA 141  K 3.8  CL 103  CO2 21  GLUCOSE 188*  BUN 17  CREATININE 0.63  CALCIUM 9.3   Liver Function Tests:  Recent Labs Lab 06/09/2014 1547  AST 18  ALT 8  ALKPHOS 46  BILITOT 0.5  PROT 6.7  ALBUMIN 3.4*    Recent Labs Lab 06/23/2014 1547  LIPASE 13   No results found for this basename: AMMONIA,  in the last 168 hours CBC:  Recent Labs Lab 05/26/2014 1547  WBC 8.8  NEUTROABS 7.7  HGB 10.0*  HCT 30.3*  MCV 82.1  PLT 341   Cardiac Enzymes: No results found for this basename: CKTOTAL, CKMB, CKMBINDEX, TROPONINI,  in the last 168 hours  BNP (last 3 results)  Recent Labs  05/29/2014 1547  PROBNP 1364.0*   CBG: No results found for this basename: GLUCAP,  in the last 168 hours  Radiological Exams on Admission: Ct Head Wo Contrast  06/13/2014   CLINICAL DATA:  Weakness.  EXAM: CT HEAD WITHOUT CONTRAST  TECHNIQUE: Contiguous axial images were obtained from the base of the skull through the vertex without intravenous contrast.  COMPARISON:  None.  FINDINGS: Stable age related cerebral atrophy, ventriculomegaly and periventricular white matter disease. No extra-axial fluid  collections are identified. No CT findings for acute hemispheric infarction or intracranial hemorrhage. No mass lesions. The brainstem and cerebellum are normal.  The bony structures are intact. No skull fracture. The paranasal sinuses and mastoid air cells are clear. The globes are intact.  IMPRESSION: Age related cerebral atrophy, ventriculomegaly and periventricular white matter disease.  No acute intracranial findings or mass lesion.   Electronically Signed   By: Loralie Champagne M.D.   On: 06/13/2014 00:05   Ct Abdomen Pelvis W Contrast  05/30/2014   CLINICAL DATA:  Mid abdominal pain since yesterday.  EXAM: CT ABDOMEN AND PELVIS WITH CONTRAST  TECHNIQUE: Multidetector CT imaging of the abdomen and pelvis was performed using the standard protocol following bolus administration of intravenous contrast.  CONTRAST:  80mL OMNIPAQUE  IOHEXOL 300 MG/ML  SOLN  COMPARISON:  None.  FINDINGS: Lung Bases: Patchy airspace disease in the right base posteriorly may be atelectatic, but aspiration and/or infection could have this appearance. There are tiny bilateral pleural effusions, right greater than left.  Liver:  No focal mass lesion within the liver.  Spleen: No splenomegaly. No focal mass lesion.  Stomach: Small hiatal hernia.  No gastric distention.  Pancreas: Slight prominence of the main pancreatic duct measuring 4 mm diameter in the head of the pancreas. No pancreatic head mass is evident on this study.  Gallbladder/Biliary: Gallbladder is markedly distended with some probable layering tiny calcified stones along the dependent wall. Mild intrahepatic biliary duct dilatation is evident. Extrahepatic common duct measures approximately 6 mm in diameter.  Kidneys/Adrenals: No adrenal nodule or mass. Areas of focal cortical scarring are seen in both kidneys.  Bowel Loops: Duodenum is redundant. Small bowel is diffusely distended measuring up to 3.0 cm in diameter. No evidence for pneumatosis. The colon is decompressed  throughout. Diverticular changes are noted in the left colon without diverticulitis.  Nodes: No evidence for lymphadenopathy in the abdomen or pelvis.  Vasculature: Atherosclerotic calcification is noted in the wall of the abdominal aorta without aneurysm. The portal vein and superior mesenteric vein are patent. Splenic vein is patent. The celiac axis, superior mesenteric artery and inferior mesenteric artery opacified.  Pelvic Genitourinary: Bladder is minimally distended. Uterus is surgically absent. No evidence for adnexal mass.  Bones/Musculoskeletal: Bone windows reveal no worrisome lytic or sclerotic osseous lesions.  Body Wall: There is diffuse body wall edema in the abdomen and pelvis. No evidence for body wall hernia.  Other: Moderate volume intraperitoneal free fluid is associated with mesenteric edema.  IMPRESSION: 1. Diffuse mild small bowel dilatation. Although a distinct transition zone cannot be identified, the terminal ileum appears decompressed as does the colon. Given the colonic decompression, a distal small bowel obstruction is considered more likely based on imaging band diffuse ileus. Again, the etiology of the obstruction is not evident on this scan. Specifically, no evidence for body wall hernia and there is no distinct transition zone evident. 2. Moderate intraperitoneal free fluid with diffuse body wall edema. 3. Distended gallbladder with layering tiny gallstones. No substantial biliary dilatation.   Electronically Signed   By: Kennith Center M.D.   On: 06/16/2014 21:45   Dg Abd Acute W/chest  06/23/2014   CLINICAL DATA:  Abdominal pain  EXAM: ACUTE ABDOMEN SERIES (ABDOMEN 2 VIEW & CHEST 1 VIEW)  COMPARISON:  12/30/2013  FINDINGS: Mild cardiac enlargement with normal pulmonary vascularity. Lungs are clear  Dilated small bowel loops of air-fluid levels suggesting proximal small bowel obstruction. No free air. Colon is decompressed.  IMPRESSION: No active cardiopulmonary disease.  Proximal  small bowel obstruction.   Electronically Signed   By: Marlan Palau M.D.   On: 06/15/2014 17:07    EKG: Independently reviewed. Atrial fibrillation with rate around 108 beats per minute.  Assessment/Plan Principal Problem:   Small bowel obstruction Active Problems:   Atrial fibrillation   Essential hypertension   Congestive heart disease   SBO (small bowel obstruction)   1. Small bowel obstruction - we'll keep patient n.p.o. Surgery to see patient in consult. Patient had received 1 L normal saline bolus. Since patient has CHF and lower extremity edema we will hold off any further IV fluids and closely follow intake output and metabolic panel and patient's fluid status. 2. Generalized weakness with difficulty ambulating - probably secondary  to patient's worsening arthritis of her knees. CT head was negative for any acute. Check uric acid levels for any gout. Patient's weakness could also be due to patient being on increased dose of Lasix recently. 3. Atrial fibrillation - rate mildly increased. Closely observe in telemetry. 4. History of CHF EF not known - patient has significant lower extremity edema. Since patient is n.p.o. and have small bowel obstruction at this time I have held patient's Lasix and also have held any further IV fluids. Closely follow respiratory status. Since patient has significant edema I have ordered Doppler stitches for DVT. 5. Anemia - baseline not known. Follow CBC. 6. Hypertension - since patient is n.p.o. patient has been placed on when necessary IV hydralazine.    Code Status: Full code.  Family Communication: Patient's daughter at the bedside.  Disposition Plan: Admit to inpatient.    Tamaria Dunleavy N. [facility] Pager [phone].  If 7PM-7AM, please contact night-coverage [email or web address] Password [email or web address] 06/13/2014, 12:09 AM

## 2014-06-14 ENCOUNTER — Inpatient Hospital Stay (HOSPITAL_COMMUNITY): Payer: Medicare Other

## 2014-06-14 DIAGNOSIS — R609 Edema, unspecified: Secondary | ICD-10-CM

## 2014-06-14 LAB — BASIC METABOLIC PANEL
Anion gap: 17 — ABNORMAL HIGH (ref 5–15)
BUN: 20 mg/dL (ref 6–23)
CALCIUM: 8.3 mg/dL — AB (ref 8.4–10.5)
CO2: 20 mEq/L (ref 19–32)
Chloride: 107 mEq/L (ref 96–112)
Creatinine, Ser: 0.68 mg/dL (ref 0.50–1.10)
GFR calc Af Amer: 85 mL/min — ABNORMAL LOW (ref >90)
GFR, EST NON AFRICAN AMERICAN: 73 mL/min — AB (ref >90)
GLUCOSE: 93 mg/dL (ref 70–99)
Potassium: 3.3 mEq/L — ABNORMAL LOW (ref 3.7–5.3)
Sodium: 144 mEq/L (ref 137–147)

## 2014-06-14 LAB — GLUCOSE, CAPILLARY
GLUCOSE-CAPILLARY: 100 mg/dL — AB (ref 70–99)
GLUCOSE-CAPILLARY: 122 mg/dL — AB (ref 70–99)
Glucose-Capillary: 93 mg/dL (ref 70–99)
Glucose-Capillary: 103 mg/dL — ABNORMAL HIGH (ref 70–99)
Glucose-Capillary: 118 mg/dL — ABNORMAL HIGH (ref 70–99)
Glucose-Capillary: 136 mg/dL — ABNORMAL HIGH (ref 70–99)

## 2014-06-14 LAB — URINALYSIS, ROUTINE W REFLEX MICROSCOPIC
GLUCOSE, UA: NEGATIVE mg/dL
HGB URINE DIPSTICK: NEGATIVE
KETONES UR: 40 mg/dL — AB
Leukocytes, UA: NEGATIVE
Nitrite: NEGATIVE
Protein, ur: NEGATIVE mg/dL
Specific Gravity, Urine: 1.028 (ref 1.005–1.030)
Urobilinogen, UA: 0.2 mg/dL (ref 0.0–1.0)
pH: 5.5 (ref 5.0–8.0)

## 2014-06-14 MED ORDER — POTASSIUM CHLORIDE 2 MEQ/ML IV SOLN
INTRAVENOUS | Status: DC
  Administered 2014-06-14: 13:00:00 via INTRAVENOUS
  Filled 2014-06-14 (×3): qty 1000

## 2014-06-14 NOTE — Progress Notes (Signed)
Subjective: Patient reports some flatus, still no BM Still refusing NG tube Abdomen minimally tender  Objective: Vital signs in last 24 hours: Temp:  [97.1 F (36.2 C)-99.9 F (37.7 C)] 98.2 F (36.8 C) (08/21 1610) Pulse Rate:  [48-88] 74 (08/21 0632) Resp:  [18-20] 20 (08/21 9604) BP: (120-138)/(46-77) 120/77 mmHg (08/21 0632) SpO2:  [94 %-100 %] 94 % (08/21 5409) Weight:  [124 lb 9 oz (56.5 kg)] 124 lb 9 oz (56.5 kg) (08/21 8119) Last BM Date: 06/02/2014  Intake/Output from previous day: 08/20 0701 - 08/21 0700 In: 29.2 [I.V.:29.2] Out: 300 [Urine:300] Intake/Output this shift:    General appearance: alert, cooperative and no distress GI: mildly dilated, minimal tenderness RLQ  Lab Results:   Recent Labs  06/07/2014 1547 06/13/14 0503  WBC 8.8 7.9  HGB 10.0* 9.6*  HCT 30.3* 28.6*  PLT 341 327   BMET  Recent Labs  06/08/2014 1547 06/13/14 0503  NA 141 143  K 3.8 3.3*  CL 103 106  CO2 21 23  GLUCOSE 188* 125*  BUN 17 17  CREATININE 0.63 0.67  CALCIUM 9.3 8.5   PT/INR No results found for this basename: LABPROT, INR,  in the last 72 hours ABG No results found for this basename: PHART, PCO2, PO2, HCO3,  in the last 72 hours  Studies/Results: Ct Head Wo Contrast  06/13/2014   CLINICAL DATA:  Weakness.  EXAM: CT HEAD WITHOUT CONTRAST  TECHNIQUE: Contiguous axial images were obtained from the base of the skull through the vertex without intravenous contrast.  COMPARISON:  None.  FINDINGS: Stable age related cerebral atrophy, ventriculomegaly and periventricular white matter disease. No extra-axial fluid collections are identified. No CT findings for acute hemispheric infarction or intracranial hemorrhage. No mass lesions. The brainstem and cerebellum are normal.  The bony structures are intact. No skull fracture. The paranasal sinuses and mastoid air cells are clear. The globes are intact.  IMPRESSION: Age related cerebral atrophy, ventriculomegaly and  periventricular white matter disease.  No acute intracranial findings or mass lesion.   Electronically Signed   By: Loralie Champagne M.D.   On: 06/13/2014 00:05   Ct Abdomen Pelvis W Contrast  06/11/2014   CLINICAL DATA:  Mid abdominal pain since yesterday.  EXAM: CT ABDOMEN AND PELVIS WITH CONTRAST  TECHNIQUE: Multidetector CT imaging of the abdomen and pelvis was performed using the standard protocol following bolus administration of intravenous contrast.  CONTRAST:  80mL OMNIPAQUE IOHEXOL 300 MG/ML  SOLN  COMPARISON:  None.  FINDINGS: Lung Bases: Patchy airspace disease in the right base posteriorly may be atelectatic, but aspiration and/or infection could have this appearance. There are tiny bilateral pleural effusions, right greater than left.  Liver:  No focal mass lesion within the liver.  Spleen: No splenomegaly. No focal mass lesion.  Stomach: Small hiatal hernia.  No gastric distention.  Pancreas: Slight prominence of the main pancreatic duct measuring 4 mm diameter in the head of the pancreas. No pancreatic head mass is evident on this study.  Gallbladder/Biliary: Gallbladder is markedly distended with some probable layering tiny calcified stones along the dependent wall. Mild intrahepatic biliary duct dilatation is evident. Extrahepatic common duct measures approximately 6 mm in diameter.  Kidneys/Adrenals: No adrenal nodule or mass. Areas of focal cortical scarring are seen in both kidneys.  Bowel Loops: Duodenum is redundant. Small bowel is diffusely distended measuring up to 3.0 cm in diameter. No evidence for pneumatosis. The colon is decompressed throughout. Diverticular changes are noted in the  left colon without diverticulitis.  Nodes: No evidence for lymphadenopathy in the abdomen or pelvis.  Vasculature: Atherosclerotic calcification is noted in the wall of the abdominal aorta without aneurysm. The portal vein and superior mesenteric vein are patent. Splenic vein is patent. The celiac axis,  superior mesenteric artery and inferior mesenteric artery opacified.  Pelvic Genitourinary: Bladder is minimally distended. Uterus is surgically absent. No evidence for adnexal mass.  Bones/Musculoskeletal: Bone windows reveal no worrisome lytic or sclerotic osseous lesions.  Body Wall: There is diffuse body wall edema in the abdomen and pelvis. No evidence for body wall hernia.  Other: Moderate volume intraperitoneal free fluid is associated with mesenteric edema.  IMPRESSION: 1. Diffuse mild small bowel dilatation. Although a distinct transition zone cannot be identified, the terminal ileum appears decompressed as does the colon. Given the colonic decompression, a distal small bowel obstruction is considered more likely based on imaging band diffuse ileus. Again, the etiology of the obstruction is not evident on this scan. Specifically, no evidence for body wall hernia and there is no distinct transition zone evident. 2. Moderate intraperitoneal free fluid with diffuse body wall edema. 3. Distended gallbladder with layering tiny gallstones. No substantial biliary dilatation.   Electronically Signed   By: Kennith Center M.D.   On: 2014-07-06 21:45   Dg Abd Acute W/chest  06/14/2014   CLINICAL DATA:  Abdominal pain  EXAM: ACUTE ABDOMEN SERIES (ABDOMEN 2 VIEW & CHEST 1 VIEW)  COMPARISON:  Yesterday  FINDINGS: Bilateral central and basilar pulmonary opacity would low volumes. Normal heart size.  No free intraperitoneal gas on the left decubitus image. Dilated small bowel loops with air-fluid levels persist. Contrast is present in the colon.  IMPRESSION: Stable partial small bowel obstruction pattern.  Bibasilar pulmonary opacity likely a combination of pleural fluid and airspace disease.   Electronically Signed   By: Maryclare Bean M.D.   On: 06/14/2014 09:53   Dg Abd Acute W/chest  06/13/2014   CLINICAL DATA:  Followup of bowel obstruction.  EXAM: ACUTE ABDOMEN SERIES (ABDOMEN 2 VIEW & CHEST 1 VIEW)  COMPARISON:  CT  and acute abdomen series of of 1 day prior  FINDINGS: Frontal view of the chest demonstrates mild patient rotation to the right. Midline trachea. Mild cardiomegaly. Moderate right hemidiaphragm elevation. No pleural effusion or pneumothorax. Low lung volumes. Possible mild pulmonary venous congestion. Minimal subsegmental atelectasis at the bases.  Abdominal films demonstrate multiple small bowel air-fluid levels on right-sided decubitus positioning. No free intraperitoneal air.  Small bowel distension persists on supine imaging. Example 3.8 cm. Similar.  Contrast within the urinary bladder. Relative paucity of colonic gas. Osteopenia.  IMPRESSION: Similar small bowel obstruction, without free intraperitoneal air or other acute complication.  Cardiomegaly with low lung volumes. Cannot exclude mild pulmonary venous congestion.   Electronically Signed   By: Jeronimo Greaves M.D.   On: 06/13/2014 10:53   Dg Abd Acute W/chest  07/06/2014   CLINICAL DATA:  Abdominal pain  EXAM: ACUTE ABDOMEN SERIES (ABDOMEN 2 VIEW & CHEST 1 VIEW)  COMPARISON:  12/30/2013  FINDINGS: Mild cardiac enlargement with normal pulmonary vascularity. Lungs are clear  Dilated small bowel loops of air-fluid levels suggesting proximal small bowel obstruction. No free air. Colon is decompressed.  IMPRESSION: No active cardiopulmonary disease.  Proximal small bowel obstruction.   Electronically Signed   By: Marlan Palau M.D.   On: 07/06/2014 17:07    Anti-infectives: Anti-infectives   None      Assessment/Plan: s/p *  No surgery found * Small bowel obstruction We still recommend NG tube for stomach and SB decompression, but patient is refusing Continue NPO   LOS: 2 days    Tyrea Froberg K. 06/14/2014

## 2014-06-14 NOTE — Progress Notes (Signed)
*  PRELIMINARY RESULTS* Vascular Ultrasound Lower extremity venous duplex has been completed.  Preliminary findings: no evidence of DVT. Baker's cyst noted on the left.  Farrel DemarkJill Eunice, RDMS, RVT  06/14/2014, 11:06 AM

## 2014-06-14 NOTE — Consult Note (Signed)
WOC wound consult note Reason for Consult: sacral pressure ulcer.  Pt from home cared for 24 hours a day by family. She is home alone some of the time. She sleeps in a recliner and she does report incontinence of B/B. She wears a brief at home.  Wound type: Scattered Stage III (2) and Stage II (3) pressure ulcers over the sacrum.  Believe these to also be related to moisture since she does wear a brief for containment.  Pressure Ulcer POA: Yes Measurement: scattered 5 areas over sacrum all are aprox. 0.5cm x 0.5cm x 0.2cm, 2 areas are full thickness and 3 areas only partial thickness Wound bed: all are clean, pink, and moist Drainage (amount, consistency, odor) minimal Periwound: intact, slight maceration Dressing procedure/placement/frequency: Continue silicone foam implemented by the skin care protocols, change every 3 days. Turn and reposition every 2 hours. May want to provide zinc based barrier ointment for use at home.   Discussed POC with patient and bedside nurse.  Re consult if needed, will not follow at this time. Thanks  Kienna Moncada [employer] RN, CWOCN [phone])

## 2014-06-14 NOTE — Progress Notes (Signed)
[facility] PROGRESS NOTE  Paula Nash YQM:[id number] DOB: 1919-05-19 DOA: 05/27/2014 PCP: Burtis Junes, MD  Assessment/Plan: Small bowel obstruction: Admitted to telemetry.  She is currently refusing NG tube placement. Surgery consulted. NPO, gentle hydration. She is passing flatus and a small BM yesterday.  Hypokalemia  Replete as needed. Will get magnesium levels.   Generalized weakness; PT/OT EVAL.    Atrial fibrillation: Rate controlled.    Anemia: Stable.   Hypertension: Suboptimal.      Code Status: full code. Confirmed today.  Family Communication:daughter over the phone Disposition Plan: pending.    Consultants:  Surgery.  Procedures:  Acute abdomen  Antibiotics:  none  HPI/Subjective: Reports she is hungry, refusing NG tube placement.   Objective: Filed Vitals:   06/14/14 0632  BP: 120/77  Pulse: 74  Temp: 98.2 F (36.8 C)  Resp: 20    Intake/Output Summary (Last 24 hours) at 06/14/14 1100 Last data filed at 06/13/14 1830  Gross per 24 hour  Intake  29.17 ml  Output    300 ml  Net -270.83 ml   Filed Weights   06/08/2014 2354 06/13/14 0550 06/14/14 5284  Weight: 56.7 kg (125 lb) 56.7 kg (125 lb) 56.5 kg (124 lb 9 oz)    Exam:   General:  Alert afebrile comfortable  Cardiovascular: s1s2  Respiratory: chest clear to auscultation, no wheezing or rhonchi  Abdomen: soft mILD tenderness generalized  Musculoskeletal: trace edema in the lower extremities.   Data Reviewed: Basic Metabolic Panel:  Recent Labs Lab 06/02/2014 1547 06/13/14 0503  NA 141 143  K 3.8 3.3*  CL 103 106  CO2 21 23  GLUCOSE 188* 125*  BUN 17 17  CREATININE 0.63 0.67  CALCIUM 9.3 8.5   Liver Function Tests:  Recent Labs Lab 06/11/2014 1547 06/13/14 0503  AST 18 15  ALT 8 6  ALKPHOS 46 38*  BILITOT 0.5 0.4  PROT 6.7 5.6*  ALBUMIN 3.4* 2.8*    Recent Labs Lab 06/04/2014 1547  LIPASE 13   No results found for this  basename: AMMONIA,  in the last 168 hours CBC:  Recent Labs Lab 06/23/2014 1547 06/13/14 0503  WBC 8.8 7.9  NEUTROABS 7.7 5.3  HGB 10.0* 9.6*  HCT 30.3* 28.6*  MCV 82.1 82.2  PLT 341 327   Cardiac Enzymes: No results found for this basename: CKTOTAL, CKMB, CKMBINDEX, TROPONINI,  in the last 168 hours BNP (last 3 results)  Recent Labs  06/24/2014 1547  PROBNP 1364.0*   CBG:  Recent Labs Lab 06/13/14 1624 06/13/14 1953 06/14/14 0013 06/14/14 0419 06/14/14 0731  GLUCAP 116* 112* 100* 122* 93    No results found for this or any previous visit (from the past 240 hour(s)).   Studies: Ct Head Wo Contrast  06/13/2014   CLINICAL DATA:  Weakness.  EXAM: CT HEAD WITHOUT CONTRAST  TECHNIQUE: Contiguous axial images were obtained from the base of the skull through the vertex without intravenous contrast.  COMPARISON:  None.  FINDINGS: Stable age related cerebral atrophy, ventriculomegaly and periventricular white matter disease. No extra-axial fluid collections are identified. No CT findings for acute hemispheric infarction or intracranial hemorrhage. No mass lesions. The brainstem and cerebellum are normal.  The bony structures are intact. No skull fracture. The paranasal sinuses and mastoid air cells are clear. The globes are intact.  IMPRESSION: Age related cerebral atrophy, ventriculomegaly and periventricular white matter disease.  No acute intracranial findings or mass lesion.   Electronically Signed  By: Loralie ChampagneMark  Gallerani M.D.   On: 06/13/2014 00:05   Ct Abdomen Pelvis W Contrast  07-05-14   CLINICAL DATA:  Mid abdominal pain since yesterday.  EXAM: CT ABDOMEN AND PELVIS WITH CONTRAST  TECHNIQUE: Multidetector CT imaging of the abdomen and pelvis was performed using the standard protocol following bolus administration of intravenous contrast.  CONTRAST:  80mL OMNIPAQUE IOHEXOL 300 MG/ML  SOLN  COMPARISON:  None.  FINDINGS: Lung Bases: Patchy airspace disease in the right base  posteriorly may be atelectatic, but aspiration and/or infection could have this appearance. There are tiny bilateral pleural effusions, right greater than left.  Liver:  No focal mass lesion within the liver.  Spleen: No splenomegaly. No focal mass lesion.  Stomach: Small hiatal hernia.  No gastric distention.  Pancreas: Slight prominence of the main pancreatic duct measuring 4 mm diameter in the head of the pancreas. No pancreatic head mass is evident on this study.  Gallbladder/Biliary: Gallbladder is markedly distended with some probable layering tiny calcified stones along the dependent wall. Mild intrahepatic biliary duct dilatation is evident. Extrahepatic common duct measures approximately 6 mm in diameter.  Kidneys/Adrenals: No adrenal nodule or mass. Areas of focal cortical scarring are seen in both kidneys.  Bowel Loops: Duodenum is redundant. Small bowel is diffusely distended measuring up to 3.0 cm in diameter. No evidence for pneumatosis. The colon is decompressed throughout. Diverticular changes are noted in the left colon without diverticulitis.  Nodes: No evidence for lymphadenopathy in the abdomen or pelvis.  Vasculature: Atherosclerotic calcification is noted in the wall of the abdominal aorta without aneurysm. The portal vein and superior mesenteric vein are patent. Splenic vein is patent. The celiac axis, superior mesenteric artery and inferior mesenteric artery opacified.  Pelvic Genitourinary: Bladder is minimally distended. Uterus is surgically absent. No evidence for adnexal mass.  Bones/Musculoskeletal: Bone windows reveal no worrisome lytic or sclerotic osseous lesions.  Body Wall: There is diffuse body wall edema in the abdomen and pelvis. No evidence for body wall hernia.  Other: Moderate volume intraperitoneal free fluid is associated with mesenteric edema.  IMPRESSION: 1. Diffuse mild small bowel dilatation. Although a distinct transition zone cannot be identified, the terminal ileum  appears decompressed as does the colon. Given the colonic decompression, a distal small bowel obstruction is considered more likely based on imaging band diffuse ileus. Again, the etiology of the obstruction is not evident on this scan. Specifically, no evidence for body wall hernia and there is no distinct transition zone evident. 2. Moderate intraperitoneal free fluid with diffuse body wall edema. 3. Distended gallbladder with layering tiny gallstones. No substantial biliary dilatation.   Electronically Signed   By: Kennith CenterEric  Mansell M.D.   On: 009-11-15 21:45   Dg Abd Acute W/chest  06/14/2014   CLINICAL DATA:  Abdominal pain  EXAM: ACUTE ABDOMEN SERIES (ABDOMEN 2 VIEW & CHEST 1 VIEW)  COMPARISON:  Yesterday  FINDINGS: Bilateral central and basilar pulmonary opacity would low volumes. Normal heart size.  No free intraperitoneal gas on the left decubitus image. Dilated small bowel loops with air-fluid levels persist. Contrast is present in the colon.  IMPRESSION: Stable partial small bowel obstruction pattern.  Bibasilar pulmonary opacity likely a combination of pleural fluid and airspace disease.   Electronically Signed   By: Maryclare BeanArt  Hoss M.D.   On: 06/14/2014 09:53   Dg Abd Acute W/chest  06/13/2014   CLINICAL DATA:  Followup of bowel obstruction.  EXAM: ACUTE ABDOMEN SERIES (ABDOMEN 2 VIEW &  CHEST 1 VIEW)  COMPARISON:  CT and acute abdomen series of of 1 day prior  FINDINGS: Frontal view of the chest demonstrates mild patient rotation to the right. Midline trachea. Mild cardiomegaly. Moderate right hemidiaphragm elevation. No pleural effusion or pneumothorax. Low lung volumes. Possible mild pulmonary venous congestion. Minimal subsegmental atelectasis at the bases.  Abdominal films demonstrate multiple small bowel air-fluid levels on right-sided decubitus positioning. No free intraperitoneal air.  Small bowel distension persists on supine imaging. Example 3.8 cm. Similar.  Contrast within the urinary bladder.  Relative paucity of colonic gas. Osteopenia.  IMPRESSION: Similar small bowel obstruction, without free intraperitoneal air or other acute complication.  Cardiomegaly with low lung volumes. Cannot exclude mild pulmonary venous congestion.   Electronically Signed   By: Jeronimo Greaves M.D.   On: 06/13/2014 10:53   Dg Abd Acute W/chest  06/11/2014   CLINICAL DATA:  Abdominal pain  EXAM: ACUTE ABDOMEN SERIES (ABDOMEN 2 VIEW & CHEST 1 VIEW)  COMPARISON:  12/30/2013  FINDINGS: Mild cardiac enlargement with normal pulmonary vascularity. Lungs are clear  Dilated small bowel loops of air-fluid levels suggesting proximal small bowel obstruction. No free air. Colon is decompressed.  IMPRESSION: No active cardiopulmonary disease.  Proximal small bowel obstruction.   Electronically Signed   By: Marlan Palau M.D.   On: 06/20/2014 17:07    Scheduled Meds: . antiseptic oral rinse  7 mL Mouth Rinse BID  . enoxaparin (LOVENOX) injection  30 mg Subcutaneous Q24H  . pantoprazole (PROTONIX) IV  40 mg Intravenous Q24H  . sodium chloride  3 mL Intravenous Q12H  . sodium chloride  3 mL Intravenous Q12H  . Travoprost (BAK Free)  1 drop Both Eyes QHS   Continuous Infusions: . dextrose 5 %-0.45% NaCl with KCl Pediatric custom IV fluid      Principal Problem:   Small bowel obstruction Active Problems:   Atrial fibrillation   Essential hypertension   Congestive heart disease   SBO (small bowel obstruction)    Time spent: 30 minutes.     [facility]  [facility] Pager [phone]. If 7PM-7AM, please contact night-coverage at [email or web address], password [facility] 06/14/2014, 11:00 AM  LOS: 2 days

## 2014-06-15 ENCOUNTER — Inpatient Hospital Stay (HOSPITAL_COMMUNITY): Payer: Medicare Other

## 2014-06-15 LAB — COMPREHENSIVE METABOLIC PANEL
ALT: 6 U/L (ref 0–35)
AST: 15 U/L (ref 0–37)
Albumin: 2.4 g/dL — ABNORMAL LOW (ref 3.5–5.2)
Alkaline Phosphatase: 36 U/L — ABNORMAL LOW (ref 39–117)
Anion gap: 19 — ABNORMAL HIGH (ref 5–15)
BILIRUBIN TOTAL: 0.4 mg/dL (ref 0.3–1.2)
BUN: 20 mg/dL (ref 6–23)
CO2: 17 meq/L — AB (ref 19–32)
Calcium: 8.3 mg/dL — ABNORMAL LOW (ref 8.4–10.5)
Chloride: 107 mEq/L (ref 96–112)
Creatinine, Ser: 0.65 mg/dL (ref 0.50–1.10)
GFR calc Af Amer: 86 mL/min — ABNORMAL LOW (ref >90)
GFR, EST NON AFRICAN AMERICAN: 74 mL/min — AB (ref >90)
GLUCOSE: 159 mg/dL — AB (ref 70–99)
Potassium: 3.4 mEq/L — ABNORMAL LOW (ref 3.7–5.3)
SODIUM: 143 meq/L (ref 137–147)
Total Protein: 5.3 g/dL — ABNORMAL LOW (ref 6.0–8.3)

## 2014-06-15 LAB — CBC WITH DIFFERENTIAL/PLATELET
BASOS ABS: 0 10*3/uL (ref 0.0–0.1)
BASOS PCT: 0 % (ref 0–1)
EOS ABS: 0 10*3/uL (ref 0.0–0.7)
Eosinophils Relative: 1 % (ref 0–5)
HCT: 29.9 % — ABNORMAL LOW (ref 36.0–46.0)
Hemoglobin: 10 g/dL — ABNORMAL LOW (ref 12.0–15.0)
Lymphocytes Relative: 42 % (ref 12–46)
Lymphs Abs: 1.3 10*3/uL (ref 0.7–4.0)
MCH: 27.5 pg (ref 26.0–34.0)
MCHC: 33.4 g/dL (ref 30.0–36.0)
MCV: 82.4 fL (ref 78.0–100.0)
Monocytes Absolute: 0.6 10*3/uL (ref 0.1–1.0)
Monocytes Relative: 20 % — ABNORMAL HIGH (ref 3–12)
NEUTROS PCT: 37 % — AB (ref 43–77)
Neutro Abs: 1.1 10*3/uL — ABNORMAL LOW (ref 1.7–7.7)
PLATELETS: 323 10*3/uL (ref 150–400)
RBC: 3.63 MIL/uL — AB (ref 3.87–5.11)
RDW: 15.4 % (ref 11.5–15.5)
WBC: 3.1 10*3/uL — ABNORMAL LOW (ref 4.0–10.5)

## 2014-06-15 LAB — MAGNESIUM: MAGNESIUM: 2 mg/dL (ref 1.5–2.5)

## 2014-06-15 LAB — GLUCOSE, CAPILLARY
GLUCOSE-CAPILLARY: 167 mg/dL — AB (ref 70–99)
GLUCOSE-CAPILLARY: 177 mg/dL — AB (ref 70–99)
Glucose-Capillary: 185 mg/dL — ABNORMAL HIGH (ref 70–99)
Glucose-Capillary: 159 mg/dL — ABNORMAL HIGH (ref 70–99)
Glucose-Capillary: 169 mg/dL — ABNORMAL HIGH (ref 70–99)
Glucose-Capillary: 158 mg/dL — ABNORMAL HIGH (ref 70–99)

## 2014-06-15 MED ORDER — POTASSIUM CHLORIDE 2 MEQ/ML IV SOLN
INTRAVENOUS | Status: DC
  Administered 2014-06-15: 12:00:00 via INTRAVENOUS
  Filled 2014-06-15 (×2): qty 1000

## 2014-06-15 MED ORDER — DILTIAZEM HCL 100 MG IV SOLR
5.0000 mg/h | INTRAVENOUS | Status: DC
  Administered 2014-06-15 – 2014-06-16 (×3): 5 mg/h via INTRAVENOUS
  Filled 2014-06-15 (×3): qty 100

## 2014-06-15 NOTE — Progress Notes (Signed)
[facility] PROGRESS NOTE  Paula Nash ZOX:[id number] DOB: 1918-12-15 DOA: July 06, 2014 PCP: Burtis Junes, MD  Assessment/Plan: Small bowel obstruction: Admitted to telemetry.  Repeat abd films show improvement. Started on clears by surgery. Continue to monitor.    Hypokalemia  Replete as needed. Will get magnesium levels.   Generalized weakness; PT/OT EVAL.    Atrial fibrillation: uncontrolled overnight and was started on cardizem drip.   currently Rate controlled.    Anemia: Stable.   Hypertension: Suboptimal.      Code Status: full code. Confirmed today.  Family Communication:daughter over the phone Disposition Plan: SNF when stable.    Consultants:  Surgery.  Procedures:  Acute abdomen  Antibiotics:  none  HPI/Subjective: She is comfortable. Reports no abdominal pain. Slightly nauseated, attributes to not eating.   Objective: Filed Vitals:   06/15/14 1440  BP: 117/63  Pulse: 93  Temp: 98 F (36.7 C)  Resp: 16    Intake/Output Summary (Last 24 hours) at 06/15/14 1717 Last data filed at 06/15/14 1316  Gross per 24 hour  Intake      6 ml  Output     60 ml  Net    -54 ml   Filed Weights   06/13/14 0550 06/14/14 0632 06/15/14 0540  Weight: 56.7 kg (125 lb) 56.5 kg (124 lb 9 oz) 55.2 kg (121 lb 11.1 oz)    Exam:   General:  Alert afebrile comfortable  Cardiovascular: s1s2  Respiratory: chest clear to auscultation, no wheezing or rhonchi  Abdomen: soft mILD tenderness generalized  Musculoskeletal: trace edema in the lower extremities.   Data Reviewed: Basic Metabolic Panel:  Recent Labs Lab 07-06-14 1547 06/13/14 0503 06/14/14 1030 06/15/14 0148  NA 141 143 144 143  K 3.8 3.3* 3.3* 3.4*  CL 103 106 107 107  CO2 21 23 20  17*  GLUCOSE 188* 125* 93 159*  BUN 17 17 20 20   CREATININE 0.63 0.67 0.68 0.65  CALCIUM 9.3 8.5 8.3* 8.3*  MG  --   --   --  2.0   Liver Function Tests:  Recent Labs Lab  07/06/2014 1547 06/13/14 0503 06/15/14 0148  AST 18 15 15   ALT 8 6 6   ALKPHOS 46 38* 36*  BILITOT 0.5 0.4 0.4  PROT 6.7 5.6* 5.3*  ALBUMIN 3.4* 2.8* 2.4*    Recent Labs Lab 2014/07/06 1547  LIPASE 13   No results found for this basename: AMMONIA,  in the last 168 hours CBC:  Recent Labs Lab Jul 06, 2014 1547 06/13/14 0503 06/15/14 0148  WBC 8.8 7.9 3.1*  NEUTROABS 7.7 5.3 1.1*  HGB 10.0* 9.6* 10.0*  HCT 30.3* 28.6* 29.9*  MCV 82.1 82.2 82.4  PLT 341 327 323   Cardiac Enzymes: No results found for this basename: CKTOTAL, CKMB, CKMBINDEX, TROPONINI,  in the last 168 hours BNP (last 3 results)  Recent Labs  July 06, 2014 1547  PROBNP 1364.0*   CBG:  Recent Labs Lab 06/15/14 0535 06/15/14 0538 06/15/14 0804 06/15/14 1214 06/15/14 1601  GLUCAP 167* 185* 159* 169* 158*    No results found for this or any previous visit (from the past 240 hour(s)).   Studies: Dg Abd 2 Views  06/15/2014   CLINICAL DATA:  Lower abdominal pain  EXAM: ABDOMEN - 2 VIEW  COMPARISON:  Prior acute abdominal series 06/14/14  FINDINGS: On the right lateral decubitus view there is an ovoid collection of air along the right abdominal wall. There is no evidence of free air on the  upright view. In the region of this same air there was a loop of bowel on the prior study. On the frontal view, there is a similar degree of gaseous distention of multiple loops of small bowel throughout the abdomen. Continued distal progression of contrast material through the colon and into the rectum. The bones appear osteopenic. Multilevel degenerative disc disease.  IMPRESSION: 1. Ovoid collection of air without bowel markings on the lateral decubitus view could not be replicated on the upright view. There was a loop of bowel in the same location on the prior acute abdominal series. I favor this to represent increased dilatation of this loop with a air layering in the bowel lumen rather than true free air. 2. Similar degree of a  multifocal loops of dilated small bowel throughout the abdomen. Small bowel there is continued distal progression of oral contrast material through the colon.   Electronically Signed   By: Malachy MoanHeath  McCullough M.D.   On: 06/15/2014 13:44   Dg Abd Acute W/chest  06/14/2014   CLINICAL DATA:  Abdominal pain  EXAM: ACUTE ABDOMEN SERIES (ABDOMEN 2 VIEW & CHEST 1 VIEW)  COMPARISON:  Yesterday  FINDINGS: Bilateral central and basilar pulmonary opacity would low volumes. Normal heart size.  No free intraperitoneal gas on the left decubitus image. Dilated small bowel loops with air-fluid levels persist. Contrast is present in the colon.  IMPRESSION: Stable partial small bowel obstruction pattern.  Bibasilar pulmonary opacity likely a combination of pleural fluid and airspace disease.   Electronically Signed   By: Maryclare BeanArt  Hoss M.D.   On: 06/14/2014 09:53    Scheduled Meds: . antiseptic oral rinse  7 mL Mouth Rinse BID  . enoxaparin (LOVENOX) injection  30 mg Subcutaneous Q24H  . pantoprazole (PROTONIX) IV  40 mg Intravenous Q24H  . sodium chloride  3 mL Intravenous Q12H  . sodium chloride  3 mL Intravenous Q12H  . Travoprost (BAK Free)  1 drop Both Eyes QHS   Continuous Infusions: . dextrose 5 %-0.45% NaCl with KCl Pediatric custom IV fluid 50 mL/hr at 06/15/14 1156  . diltiazem (CARDIZEM) infusion 5 mg/hr (06/15/14 0131)    Principal Problem:   Small bowel obstruction Active Problems:   Atrial fibrillation   Essential hypertension   Congestive heart disease   SBO (small bowel obstruction)    Time spent: 30 minutes.     [facility] CenterKULA,Alonza Knisley  [facility] Pager [phone]. If 7PM-7AM, please contact night-coverage at [email or web address], password [email or web address] 06/15/2014, 5:17 PM  LOS: 3 days

## 2014-06-15 NOTE — Progress Notes (Signed)
Patient ID: Paula Nash, female   DOB: November 01, 1918, 80 y.o.   MRN: [id number]    Subjective: Pt reports flatus again.  Denies pain.    Objective: Vital signs in last 24 hours: Temp:  [98 F (36.7 C)-99.6 F (37.6 C)] 98 F (36.7 C) (08/22 0540) Pulse Rate:  [53-102] 102 (08/22 0540) Resp:  [17-18] 18 (08/22 0540) BP: (124-153)/(53-96) 124/96 mmHg (08/22 0540) SpO2:  [90 %-94 %] 94 % (08/22 0540) Weight:  [121 lb 11.1 oz (55.2 kg)] 121 lb 11.1 oz (55.2 kg) (08/22 0540) Last BM Date: 2013/12/01  Intake/Output from previous day: 08/21 0701 - 08/22 0700 In: -  Out: 60 [Urine:60] Intake/Output this shift:    General appearance: alert, cooperative and no distress GI: soft, mildly distended, mild tenderness LLQ  Lab Results:   Recent Labs  06/13/14 0503 06/15/14 0148  WBC 7.9 3.1*  HGB 9.6* 10.0*  HCT 28.6* 29.9*  PLT 327 323   BMET  Recent Labs  06/14/14 1030 06/15/14 0148  NA 144 143  K 3.3* 3.4*  CL 107 107  CO2 20 17*  GLUCOSE 93 159*  BUN 20 20  CREATININE 0.68 0.65  CALCIUM 8.3* 8.3*   PT/INR No results found for this basename: LABPROT, INR,  in the last 72 hours ABG No results found for this basename: PHART, PCO2, PO2, HCO3,  in the last 72 hours  Studies/Results: Dg Abd Acute W/chest  06/14/2014   CLINICAL DATA:  Abdominal pain  EXAM: ACUTE ABDOMEN SERIES (ABDOMEN 2 VIEW & CHEST 1 VIEW)  COMPARISON:  Yesterday  FINDINGS: Bilateral central and basilar pulmonary opacity would low volumes. Normal heart size.  No free intraperitoneal gas on the left decubitus image. Dilated small bowel loops with air-fluid levels persist. Contrast is present in the colon.  IMPRESSION: Stable partial small bowel obstruction pattern.  Bibasilar pulmonary opacity likely a combination of pleural fluid and airspace disease.   Electronically Signed   By: Paula BeanArt  Nash M.D.   On: 06/14/2014 09:53   Dg Abd Acute W/chest  06/13/2014   CLINICAL DATA:  Followup of bowel obstruction.   EXAM: ACUTE ABDOMEN SERIES (ABDOMEN 2 VIEW & CHEST 1 VIEW)  COMPARISON:  CT and acute abdomen series of of 1 day prior  FINDINGS: Frontal view of the chest demonstrates mild patient rotation to the right. Midline trachea. Mild cardiomegaly. Moderate right hemidiaphragm elevation. No pleural effusion or pneumothorax. Low lung volumes. Possible mild pulmonary venous congestion. Minimal subsegmental atelectasis at the bases.  Abdominal films demonstrate multiple small bowel air-fluid levels on right-sided decubitus positioning. No free intraperitoneal air.  Small bowel distension persists on supine imaging. Example 3.8 cm. Similar.  Contrast within the urinary bladder. Relative paucity of colonic gas. Osteopenia.  IMPRESSION: Similar small bowel obstruction, without free intraperitoneal air or other acute complication.  Cardiomegaly with low lung volumes. Cannot exclude mild pulmonary venous congestion.   Electronically Signed   By: Jeronimo GreavesKyle  Nash M.D.   On: 06/13/2014 10:53    Anti-infectives: Anti-infectives   None      Assessment/Plan: s/p * No surgery found * Small bowel obstruction  Recheck films. Contrast in colon on yesterday's film.  If improved, could advance to clear liquid diet.     LOS: 3 days    [facility] Care HospitalBYERLY,Paula Nash 06/15/2014

## 2014-06-15 NOTE — Evaluation (Signed)
Physical Therapy Evaluation Patient Details Name: Paula Nash MRN: [id number] DOB: 1918-12-13 Today's Date: 06/15/2014   History of Present Illness  Paula Nash is a 80 y.o. female with history of a-fib, HTN, CHF, arthritis was brought to the ER after patient was having nausea vomiting and abdominal pain and weakness. As per the patient's daughter patient has not been doing well since 2 days. Patient over the last one month has been increasingly weak with increasing pain in her knees from worsening arthritis. Patient also has been having increasing lower extremity edema and her primary care physician had just increased her Lasix dose to 80 mg 2 weeks ago. Patient has been feeling difficult to walk due to pain in both her knees which has been ongoing for a long time but has worsened recently. Earlier yesterday morning patient started having nausea vomiting and lower abdominal pain. In the ER CT of the abdomen and pelvis shows features concerning for small bowel obstruction.  Clinical Impression  Pt admitted with the above. Pt currently with functional limitations due to the deficits listed below (see PT Problem List). At the time of PT eval pt was able to transfer bed>recliner with total assist +2. Pt is appropriate for the lift with nursing at this time. Unsure how close pt is to her baseline, as caregiver states that they "pick her up" to transfer her at home. Pt will benefit from skilled PT to increase their independence and safety with mobility to allow discharge to the venue listed below.       Follow Up Recommendations SNF;Supervision/Assistance - 24 hour    Equipment Recommendations  None recommended by PT    Recommendations for Other Services       Precautions / Restrictions Precautions Precautions: Fall Restrictions Weight Bearing Restrictions: No      Mobility  Bed Mobility Overal bed mobility: Needs Assistance;+2 for physical assistance Bed Mobility:  Rolling;Sidelying to Sit Rolling: Max assist Sidelying to sit: Total assist;+2 for physical assistance       General bed mobility comments: VC's for sequencing and technique. Hand-over-hand assist for pt to reach for bed rails. Unable to grasp due to arthritis in fingers. Significant assist to elevate trunk into sitting and to maintain sitting balance.   Transfers Overall transfer level: Needs assistance Equipment used: 2 person hand held assist Transfers: Sit to/from [employer] Sit to Stand: Total assist;+2 physical assistance   Squat pivot transfers: Total assist;+2 physical assistance     General transfer comment: Pt with little participation in transfer due to abdominal pain and general weakness. Not sure if this is much different than her baseline. Bed pad used to support hips and assist pt into recliner.   Ambulation/Gait             General Gait Details: Unable  Stairs            Wheelchair Mobility    Modified Rankin (Stroke Patients Only)       Balance Overall balance assessment: Needs assistance Sitting-balance support: Bilateral upper extremity supported;Feet supported Sitting balance-Leahy Scale: Zero Sitting balance - Comments: Mod-max assist to maintain sitting balance.  Postural control: Posterior lean Standing balance support: Bilateral upper extremity supported Standing balance-Leahy Scale: Zero                               Pertinent Vitals/Pain Pain Assessment: Faces Faces Pain Scale: Hurts even more Pain Location: Abdomen Pain  Intervention(s): Monitored during session;Limited activity within patient's tolerance    Home Living Family/patient expects to be discharged to:: Skilled nursing facility Living Arrangements: Alone               Additional Comments: Pt lives alone, with 2 caregivers that come in as well as daughter that helps out.    Prior Function Level of Independence: Needs assistance    Gait / Transfers Assistance Needed: Not ambulatory  ADL's / Homemaking Assistance Needed: Caregivers doing all ADL's        Hand Dominance   Dominant Hand: Right    Extremity/Trunk Assessment   Upper Extremity Assessment: Generalized weakness           Lower Extremity Assessment: Generalized weakness      Cervical / Trunk Assessment: Kyphotic  Communication   Communication: HOH  Cognition Arousal/Alertness: Lethargic Behavior During Therapy: WFL for tasks assessed/performed Overall Cognitive Status: Within Functional Limits for tasks assessed                      General Comments      Exercises        Assessment/Plan    PT Assessment Patient needs continued PT services  PT Diagnosis Generalized weakness;Acute pain   PT Problem List Decreased strength;Decreased range of motion;Decreased activity tolerance;Decreased balance;Decreased mobility;Decreased knowledge of use of DME;Decreased safety awareness;Decreased knowledge of precautions;Pain  PT Treatment Interventions DME instruction;Gait training;Stair training;Functional mobility training;Therapeutic activities;Therapeutic exercise;Neuromuscular re-education;Patient/family education   PT Goals (Current goals can be found in the Care Plan section) Acute Rehab PT Goals Patient Stated Goal: Pt did not state goals during session. Pt's caregiver states that the family's goal is rehab before return home.  PT Goal Formulation: With patient Time For Goal Achievement: 10/16/14 Potential to Achieve Goals: Fair    Frequency Min 2X/week   Barriers to discharge        Co-evaluation               End of Session Equipment Utilized During Treatment: Gait belt Activity Tolerance: Patient limited by lethargy;Patient limited by pain Patient left: in chair;with call bell/phone within reach;with family/visitor present Nurse Communication: Mobility status         Time: 1345-1404 PT Time Calculation  (min): 19 min   Charges:   PT Evaluation $Initial PT Evaluation Tier I: 1 Procedure PT Treatments $Therapeutic Activity: 8-22 mins   PT G Codes:          Ruthann CancerHamilton, Shaquela Weichert 06/15/2014, 4:34 PM  Ruthann CancerLaura Hamilton, PT, DPT Acute Rehabilitation Services Pager: [phone]

## 2014-06-15 NOTE — Progress Notes (Signed)
Paula BeaversMildred S Nash GNF:[id number] DOB: Nov 02, 1918 DOA: 03/18/2014 PCP: Burtis JunesBLOUNT,ALVIN VINCENT, MD   Subj: Paula BeaversMildred S Nash is a 80 y.o. BF PMHx  atrial fibrillation not on anticoagulants, hypertension, CHF, arthritis was brought to the ER after patient was having nausea vomiting and abdominal pain and weakness. As per the patient's daughter patient has not been doing well since 2 days. Patient over the last one month has been increasingly weak with increasing pain in her knees from worsening arthritis. Patient also has been having increasing lower extremity edema and her primary care physician had just increased her Lasix dose to 80 mg 2 weeks ago. Patient has been feeling difficult to walk due to pain in both her knees which has been ongoing for a long time but has worsened recently. Earlier yesterday morning patient started having nausea vomiting and lower abdominal pain. In the ER CT of the abdomen and pelvis shows features concerning for small bowel obstruction. Patient will be admitted for further management. Patient's weakness is generalized and nonfocal and CT head was negative for anything acute. On exam patient does have significant lower extremity edema and difficult to move her both knees due to pain. Patient usually lives along at home with caregiver. 8/22 paged to bedside secondary to patient going into A. fib with RVR in the 130s to 140s, negative CP/SOB. Currently patient on no medication for rate control per primary team's note patient had been rate controlled without medication until now. Patient complains of thirst.   Obj: Objective: VITAL SIGNS: Temp: 98.4 F (36.9 C) (08/21 2033) Temp src: Oral (08/21 2033) BP: 144/62 mmHg (08/21 2033) Pulse Rate: 53 (08/21 1415) SPO2; 90% on room air FIO2:   Intake/Output Summary (Last 24 hours) at 06/15/14 0043 Last data filed at 06/14/14 2003  Gross per 24 hour  Intake      0 ml  Output     60 ml  Net    -60 ml     Exam: General:  A./O. x4, NAD, No acute respiratory distress Lungs: Clear to auscultation bilaterally without wheezes or crackles Cardiovascular: Irregular irregular rhythm and rate, without murmur gallop or rub normal S1 and S2 Abdomen: Diffuse tenderness to palpation, nondistended, negative bowel sounds, no rebound, no ascites, no appreciable mass Extremities: No significant cyanosis, clubbing, or edema bilateral lower extremities   Procedure/Significant Events: 8/21 acute abdominal series;Stable partial small bowel obstruction pattern.-Bibasilar pulmonary opacity likely a combination of pleural fluid and airspace disease    Culture NA  Antibiotics: NA   A/P A. fib RVR -Start patient on Cardizem drip -Obtain CMP, MG, CBC  Small bowel obstruction -Counseled patient she had a small bowel structure and could not have a drink however we would allow her to have 1-2 pieces of ice in order to wet lips and mouth

## 2014-06-16 ENCOUNTER — Inpatient Hospital Stay (HOSPITAL_COMMUNITY): Payer: Medicare Other

## 2014-06-16 DIAGNOSIS — F29 Unspecified psychosis not due to a substance or known physiological condition: Secondary | ICD-10-CM

## 2014-06-16 LAB — BASIC METABOLIC PANEL
Anion gap: 12 (ref 5–15)
BUN: 15 mg/dL (ref 6–23)
CO2: 22 mEq/L (ref 19–32)
CREATININE: 0.59 mg/dL (ref 0.50–1.10)
Calcium: 8.2 mg/dL — ABNORMAL LOW (ref 8.4–10.5)
Chloride: 108 mEq/L (ref 96–112)
GFR calc non Af Amer: 77 mL/min — ABNORMAL LOW (ref >90)
GFR, EST AFRICAN AMERICAN: 89 mL/min — AB (ref >90)
Glucose, Bld: 180 mg/dL — ABNORMAL HIGH (ref 70–99)
POTASSIUM: 3.6 meq/L — AB (ref 3.7–5.3)
Sodium: 142 mEq/L (ref 137–147)

## 2014-06-16 LAB — CBC
HCT: 29.5 % — ABNORMAL LOW (ref 36.0–46.0)
Hemoglobin: 9.7 g/dL — ABNORMAL LOW (ref 12.0–15.0)
MCH: 27 pg (ref 26.0–34.0)
MCHC: 32.9 g/dL (ref 30.0–36.0)
MCV: 82.2 fL (ref 78.0–100.0)
PLATELETS: 362 10*3/uL (ref 150–400)
RBC: 3.59 MIL/uL — ABNORMAL LOW (ref 3.87–5.11)
RDW: 15.5 % (ref 11.5–15.5)
WBC: 3.1 10*3/uL — ABNORMAL LOW (ref 4.0–10.5)

## 2014-06-16 LAB — GLUCOSE, CAPILLARY
GLUCOSE-CAPILLARY: 172 mg/dL — AB (ref 70–99)
Glucose-Capillary: 152 mg/dL — ABNORMAL HIGH (ref 70–99)
Glucose-Capillary: 145 mg/dL — ABNORMAL HIGH (ref 70–99)
Glucose-Capillary: 167 mg/dL — ABNORMAL HIGH (ref 70–99)
Glucose-Capillary: 153 mg/dL — ABNORMAL HIGH (ref 70–99)

## 2014-06-16 MED ORDER — BISACODYL 10 MG RE SUPP
10.0000 mg | Freq: Once | RECTAL | Status: AC
  Administered 2014-06-16: 10 mg via RECTAL

## 2014-06-16 MED ORDER — DILTIAZEM HCL 100 MG IV SOLR
5.0000 mg/h | INTRAVENOUS | Status: DC
  Administered 2014-06-17: 5 mg/h via INTRAVENOUS
  Filled 2014-06-16 (×2): qty 100

## 2014-06-16 NOTE — Progress Notes (Addendum)
[facility] PROGRESS NOTE  Paula Nash ZOX:[id number] DOB: 1919/02/14 DOA: 2014-07-03 PCP: Burtis Junes, MD Interim summary: Paula Nash is a 80 y.o. female with history of atrial fibrillation not on anticoagulants, hypertension, CHF, arthritis was brought to the ER after patient was having nausea vomiting and abdominal pain and weakness. In the ER CT of the abdomen and pelvis shows features concerning for small bowel obstruction. Patient was admitted for further management. Surgery consulted. Her abd film showed mild improvement and clear liquid diet started. But today she reports worsening abdominal pain after eating , so we will repeat the abdominal film.    Assessment/Plan: Small bowel obstruction: Admitted to telemetry.  Repeat abd films show improvement on 8/22. Started on clears by surgery. Continue to monitor. More pain today. No BM able to pass flatus.    Hypokalemia  Replete as needed. Will get magnesium levels.   Generalized weakness; PT/OT EVAL.    Atrial fibrillation: uncontrolled overnight and was started on cardizem drip.   currently Rate controlled.    Anemia: Stable.   Hypertension: Suboptimal.   afib with RVR ont he night of 8/21: Was started on cardizem drip.      Code Status: full code. Confirmed today.  Family Communication:daughter over the phone Disposition Plan: SNF when stable.    Consultants:  Surgery.  Procedures:  Acute abdomen  Antibiotics:  none  HPI/Subjective: She reports nausea, abdo pain and headache, repeat film ordered.   Objective: Filed Vitals:   06/16/14 1412  BP: 149/61  Pulse: 108  Temp: 97.7 F (36.5 C)  Resp: 17    Intake/Output Summary (Last 24 hours) at 06/16/14 1512 Last data filed at 06/16/14 8119  Gross per 24 hour  Intake 695.75 ml  Output      0 ml  Net 695.75 ml   Filed Weights   06/14/14 0632 06/15/14 0540 06/16/14 0640  Weight: 56.5 kg (124 lb 9 oz) 55.2 kg (121  lb 11.1 oz) 57 kg (125 lb 10.6 oz)    Exam:   General:  Alert afebrile comfortable  Cardiovascular: s1s2  Respiratory: chest clear to auscultation, no wheezing or rhonchi  Abdomen: soft mILD tenderness generalized  Musculoskeletal: trace edema in the lower extremities.   Data Reviewed: Basic Metabolic Panel:  Recent Labs Lab Jul 03, 2014 1547 06/13/14 0503 06/14/14 1030 06/15/14 0148  NA 141 143 144 143  K 3.8 3.3* 3.3* 3.4*  CL 103 106 107 107  CO2 17*  GLUCOSE 188* 125* 93 159*  BUN CREATININE 0.63 0.67 0.68 0.65  CALCIUM 9.3 8.5 8.3* 8.3*  MG  --   --   --  2.0   Liver Function Tests:  Recent Labs Lab 07/03/14 1547 06/13/14 0503 06/15/14 0148  AST ALT ALKPHOS 46 38* 36*  BILITOT 0.5 0.4 0.4  PROT 6.7 5.6* 5.3*  ALBUMIN 3.4* 2.8* 2.4*    Recent Labs Lab Jul 03, 2014 1547  LIPASE 13   No results found for this basename: AMMONIA,  in the last 168 hours CBC:  Recent Labs Lab 07-03-2014 1547 06/13/14 0503 06/15/14 0148  WBC 8.8 7.9 3.1*  NEUTROABS 7.7 5.3 1.1*  HGB 10.0* 9.6* 10.0*  HCT 30.3* 28.6* 29.9*  MCV 82.1 82.2 82.4  PLT 341 327 323   Cardiac Enzymes: No results found for this basename: CKTOTAL, CKMB, CKMBINDEX, TROPONINI,  in the last 168 hours BNP (last 3 results)  Recent Labs  28-Jun-2014 1547  PROBNP 1364.0*   CBG:  Recent Labs Lab 06/15/14 1601 06/15/14 2032 06/16/14 0401 06/16/14 0739 06/16/14 1214  GLUCAP 158* 177* 152* 145* 172*    No results found for this or any previous visit (from the past 240 hour(s)).   Studies: Dg Abd 2 Views  06/15/2014   CLINICAL DATA:  Lower abdominal pain  EXAM: ABDOMEN - 2 VIEW  COMPARISON:  Prior acute abdominal series 06/14/14  FINDINGS: On the right lateral decubitus view there is an ovoid collection of air along the right abdominal wall. There is no evidence of free air on the upright view. In the region of this same air there was a loop of bowel on  the prior study. On the frontal view, there is a similar degree of gaseous distention of multiple loops of small bowel throughout the abdomen. Continued distal progression of contrast material through the colon and into the rectum. The bones appear osteopenic. Multilevel degenerative disc disease.  IMPRESSION: 1. Ovoid collection of air without bowel markings on the lateral decubitus view could not be replicated on the upright view. There was a loop of bowel in the same location on the prior acute abdominal series. I favor this to represent increased dilatation of this loop with a air layering in the bowel lumen rather than true free air. 2. Similar degree of a multifocal loops of dilated small bowel throughout the abdomen. Small bowel there is continued distal progression of oral contrast material through the colon.   Electronically Signed   By: Malachy Moan M.D.   On: 06/15/2014 13:44   Dg Abd Portable 1v  06/16/2014   CLINICAL DATA:  Reassess small-bowel obstruction  EXAM: PORTABLE ABDOMEN - 1 VIEW  COMPARISON:  Abdominal series of June 15, 2014  FINDINGS: There is a persistent distal small bowel obstruction. The contrast in the right left colon from the August 19th abdominal pelvic CT scan is is little changed. Retrocardiac density on the left is consistent with atelectasis.  IMPRESSION: There is persistent distal small bowel obstruction. There is no evidence of perforation.   Electronically Signed   By: David  [place]   On: 06/16/2014 14:40    Scheduled Meds: . antiseptic oral rinse  7 mL Mouth Rinse BID  . bisacodyl  10 mg Rectal Once  . enoxaparin (LOVENOX) injection  30 mg Subcutaneous Q24H  . pantoprazole (PROTONIX) IV  40 mg Intravenous Q24H  . sodium chloride  3 mL Intravenous Q12H  . sodium chloride  3 mL Intravenous Q12H  . Travoprost (BAK Free)  1 drop Both Eyes QHS   Continuous Infusions:    Principal Problem:   Small bowel obstruction Active Problems:   Atrial  fibrillation   Essential hypertension   Congestive heart disease   SBO (small bowel obstruction)    Time spent: 30 minutes.     [facility]  [facility] Pager [phone]. If 7PM-7AM, please contact night-coverage at [email or web address], password [facility] 06/16/2014, 3:12 PM  LOS: 4 days

## 2014-06-16 NOTE — Clinical Social Work Note (Signed)
CSW continues to follow this patient for d/c planning needs. CSW contacted daughter Bonita Quin [phone]) and left a message for follow-up. CSW to follow tomorrow.  Ravynn Hogate Patrick-Jefferson, LCSWA Weekend Clinical Social Worker [phone]

## 2014-06-16 NOTE — Progress Notes (Signed)
Agree 

## 2014-06-16 NOTE — Plan of Care (Signed)
Problem: Phase I Progression Outcomes Goal: OOB as tolerated unless otherwise ordered Outcome: Progressing Tolerated being up in chair without difficulty.

## 2014-06-16 NOTE — Progress Notes (Signed)
[facility] Progress Note     Subjective: Pt thinks its night time and she thinks she's moved rooms.  Know's she's in the hospital.  Calls family member while I'm in the room.  No N/V, not much abdominal pain.  Thinks she's having flatus, but I'm not sure if she's correct given her current confusion.    Objective: Vital signs in last 24 hours: Temp:  [98 F (36.7 C)-98.3 F (36.8 C)] 98.3 F (36.8 C) (08/23 0407) Pulse Rate:  [93-109] 109 (08/23 0407) Resp:  [16-19] 16 (08/23 0407) BP: (117-146)/(57-99) 129/99 mmHg (08/23 0407) SpO2:  [82 %-94 %] 94 % (08/23 0421) Weight:  [125 lb 10.6 oz (57 kg)] 125 lb 10.6 oz (57 kg) (08/23 0640) Last BM Date: 06/14/14  Intake/Output from previous day: 08/22 0701 - 08/23 0700 In: 1705.9 [P.O.:200; I.V.:1505.9] Out: -  Intake/Output this shift:    PE: Gen:  Alert, but noticeably confused (oriented to name and that I'm at the hospital), NAD, pleasant Abd: Soft, mild distension, NT, +BS, no HSM   Lab Results:   Recent Labs  06/15/14 0148  WBC 3.1*  HGB 10.0*  HCT 29.9*  PLT 323   BMET  Recent Labs  06/14/14 1030 06/15/14 0148  NA 144 143  K 3.3* 3.4*  CL 107 107  CO2 20 17*  GLUCOSE 93 159*  BUN 20 20  CREATININE 0.68 0.65  CALCIUM 8.3* 8.3*   PT/INR No results found for this basename: LABPROT, INR,  in the last 72 hours CMP     Component Value Date/Time   NA 143 06/15/2014 0148   K 3.4* 06/15/2014 0148   CL 107 06/15/2014 0148   CO2 17* 06/15/2014 0148   GLUCOSE 159* 06/15/2014 0148   BUN 20 06/15/2014 0148   CREATININE 0.65 06/15/2014 0148   CALCIUM 8.3* 06/15/2014 0148   PROT 5.3* 06/15/2014 0148   ALBUMIN 2.4* 06/15/2014 0148   AST 15 06/15/2014 0148   ALT 6 06/15/2014 0148   ALKPHOS 36* 06/15/2014 0148   BILITOT 0.4 06/15/2014 0148   GFRNONAA 74* 06/15/2014 0148   GFRAA 86* 06/15/2014 0148   Lipase     Component Value Date/Time   LIPASE 13 2014/06/17 1547       Studies/Results: Dg Abd 2  Views  06/15/2014   CLINICAL DATA:  Lower abdominal pain  EXAM: ABDOMEN - 2 VIEW  COMPARISON:  Prior acute abdominal series 06/14/14  FINDINGS: On the right lateral decubitus view there is an ovoid collection of air along the right abdominal wall. There is no evidence of free air on the upright view. In the region of this same air there was a loop of bowel on the prior study. On the frontal view, there is a similar degree of gaseous distention of multiple loops of small bowel throughout the abdomen. Continued distal progression of contrast material through the colon and into the rectum. The bones appear osteopenic. Multilevel degenerative disc disease.  IMPRESSION: 1. Ovoid collection of air without bowel markings on the lateral decubitus view could not be replicated on the upright view. There was a loop of bowel in the same location on the prior acute abdominal series. I favor this to represent increased dilatation of this loop with a air layering in the bowel lumen rather than true free air. 2. Similar degree of a multifocal loops of dilated small bowel throughout the abdomen. Small bowel there is continued distal progression of oral contrast material through the colon.  Electronically Signed   By: Malachy Moan M.D.   On: 06/15/2014 13:44   Dg Abd Acute W/chest  06/14/2014   CLINICAL DATA:  Abdominal pain  EXAM: ACUTE ABDOMEN SERIES (ABDOMEN 2 VIEW & CHEST 1 VIEW)  COMPARISON:  Yesterday  FINDINGS: Bilateral central and basilar pulmonary opacity would low volumes. Normal heart size.  No free intraperitoneal gas on the left decubitus image. Dilated small bowel loops with air-fluid levels persist. Contrast is present in the colon.  IMPRESSION: Stable partial small bowel obstruction pattern.  Bibasilar pulmonary opacity likely a combination of pleural fluid and airspace disease.   Electronically Signed   By: Maryclare Bean M.D.   On: 06/14/2014 09:53    Anti-infectives: Anti-infectives   None        Assessment/Plan SBO Confustion  Plan: 1.  Films a bit improved yesterday, pt is confused but says she's having flatus, but no BM yet 2.  Await better bowel function prior to advancing diet 3.  Tolerating clear liquids, but would not advance until we see if KUB improving 4.  Ambulate and IS 5.  SCD's and lovenox 6.  Try dulcolax     LOS: 4 days    [name] 06/16/2014, 8:45 AM Pager: [phone]

## 2014-06-17 ENCOUNTER — Inpatient Hospital Stay (HOSPITAL_COMMUNITY): Payer: Medicare Other

## 2014-06-17 ENCOUNTER — Encounter (HOSPITAL_COMMUNITY): Payer: Self-pay | Admitting: Cardiology

## 2014-06-17 LAB — GLUCOSE, CAPILLARY
GLUCOSE-CAPILLARY: 185 mg/dL — AB (ref 70–99)
Glucose-Capillary: 108 mg/dL — ABNORMAL HIGH (ref 70–99)
Glucose-Capillary: 124 mg/dL — ABNORMAL HIGH (ref 70–99)
Glucose-Capillary: 142 mg/dL — ABNORMAL HIGH (ref 70–99)
Glucose-Capillary: 142 mg/dL — ABNORMAL HIGH (ref 70–99)
Glucose-Capillary: 145 mg/dL — ABNORMAL HIGH (ref 70–99)

## 2014-06-17 LAB — BASIC METABOLIC PANEL
Anion gap: 13 (ref 5–15)
BUN: 17 mg/dL (ref 6–23)
CHLORIDE: 109 meq/L (ref 96–112)
CO2: 21 meq/L (ref 19–32)
Calcium: 8.2 mg/dL — ABNORMAL LOW (ref 8.4–10.5)
Creatinine, Ser: 0.57 mg/dL (ref 0.50–1.10)
GFR calc Af Amer: 90 mL/min — ABNORMAL LOW (ref >90)
GFR calc non Af Amer: 77 mL/min — ABNORMAL LOW (ref >90)
GLUCOSE: 160 mg/dL — AB (ref 70–99)
POTASSIUM: 3.8 meq/L (ref 3.7–5.3)
SODIUM: 143 meq/L (ref 137–147)

## 2014-06-17 MED ORDER — BISACODYL 10 MG RE SUPP
10.0000 mg | Freq: Once | RECTAL | Status: AC
  Administered 2014-06-17: 10 mg via RECTAL
  Filled 2014-06-17: qty 1

## 2014-06-17 MED ORDER — ASPIRIN 81 MG PO CHEW
81.0000 mg | CHEWABLE_TABLET | Freq: Every day | ORAL | Status: DC
  Administered 2014-06-17 – 2014-06-18 (×2): 81 mg via ORAL
  Filled 2014-06-17 (×3): qty 1

## 2014-06-17 MED ORDER — FUROSEMIDE 10 MG/ML IJ SOLN: 40.0000 mg | Freq: Once | INTRAMUSCULAR | Status: AC

## 2014-06-17 MED ORDER — FUROSEMIDE 10 MG/ML IJ SOLN
40.0000 mg | Freq: Once | INTRAMUSCULAR | Status: AC
  Administered 2014-06-17: 40 mg via INTRAVENOUS
  Filled 2014-06-17: qty 4

## 2014-06-17 MED ORDER — METOPROLOL TARTRATE 12.5 MG HALF TABLET
12.5000 mg | ORAL_TABLET | Freq: Two times a day (BID) | ORAL | Status: DC
Start: 2014-06-17 — End: 2014-06-19
  Administered 2014-06-17 – 2014-06-18 (×3): 12.5 mg via ORAL
  Filled 2014-06-17 (×6): qty 1

## 2014-06-17 MED ORDER — DILTIAZEM HCL 30 MG PO TABS
30.0000 mg | ORAL_TABLET | Freq: Three times a day (TID) | ORAL | Status: DC
  Administered 2014-06-17 – 2014-06-18 (×4): 30 mg via ORAL
  Filled 2014-06-17 (×9): qty 1

## 2014-06-17 MED ORDER — FUROSEMIDE 40 MG PO TABS
40.0000 mg | ORAL_TABLET | Freq: Two times a day (BID) | ORAL | Status: DC
  Administered 2014-06-18 (×2): 40 mg via ORAL
  Filled 2014-06-17 (×5): qty 1

## 2014-06-17 MED ORDER — BOOST / RESOURCE BREEZE PO LIQD
1.0000 | Freq: Three times a day (TID) | ORAL | Status: DC
  Administered 2014-06-17 – 2014-06-18 (×4): 1 via ORAL
  Administered 2014-06-18: 05:00:00 via ORAL

## 2014-06-17 NOTE — Consult Note (Signed)
Reason for Consult: PAF   Referring Physician: Dr. Karleen Hampshire PCP:  Elizabeth Palau, MD Primary Cardiologist:Dr. Rudene Re Paula Nash is an 80 y.o. female.    Chief Complaint: Pt admitted 06/13/14 with abd pain and weakness    HPI: 80 y.o. female with history of atrial fibrillation not on anticoagulants, hypertension, CHF, arthritis was brought to the ER after patient was having nausea vomiting and abdominal pain and weakness. As per the patient's daughter patient has not been doing well since 2 days prior to admit. Patient over the last one month has been increasingly weak with increasing pain in her knees from worsening arthritis. Patient also has been having increasing lower extremity edema and her primary care physician had just increased her Lasix dose to 80 mg 2 weeks ago. Patient has been feeling difficult to walk due to pain in both her knees which has been ongoing for a long time but has worsened recently. Earlier the morning prior to admit patient started having nausea vomiting and lower abdominal pain. In the ER CT of the abdomen and pelvis shows features concerning for small bowel obstruction- surgery following.  She was admitted.  On admit she was in S. Tach with freq PACs.  By the 22nd she was in A fib. She was placed on IV dilt and now has been transitioned to PO dilt. And metoprolol has been added.   She was on amiodarone in the past but did not tolerate it. Has not been on anticoagulation possibly due to risk of falls.    She denies chest pain, she stated she did not believe she has had any atrial fib in some time, but she knew when she went into it in the hospital.  Her memory though seems to come and go.    Today she complains of SOB, CXR pending.  Last Echo 2006 with LA mod. to severely dilated, rt. Atrium is mild to mod. Dilated.  The atrial septum is aneurysmal. Mild global hypokinesis of LV.  Mild concentric LVH.  Mild MR, mild TR.   Last persantine  myoview 07/14/04 negative for ischemia.    Past Medical History  Diagnosis Date  . Peptic ulcer disease   . Osteoarthritis   . Lower extremity edema   . Glaucoma   . Neuralgia     ,optic  . Hypertension   . Dysrhythmia     Past Surgical History  Procedure Laterality Date  . Appendectomy    . Vesicovaginal fistula closure w/ tah      Family History  Problem Relation Age of Onset  . Breast cancer Sister   . CAD Neg Hx   . Diabetes Mellitus II Neg Hx    Social History:  reports that she has never smoked. She does not have any smokeless tobacco history on file. She reports that she does not drink alcohol or use illicit drugs.  Lives in assisted living.  Allergies:  Allergies  Allergen Reactions  . Celebrex [Celecoxib] Other (See Comments)    Myalgia  . Amiodarone Hcl [Amiodarone] Cough    Medications Prior to Admission  Medication Sig Dispense Refill  . alum & mag hydroxide-simeth (MAALOX PLUS) 400-400-40 MG/5ML suspension Take 10 mLs by mouth every 6 (six) hours as needed for indigestion.      Marland Kitchen aspirin 81 MG tablet Take 81 mg by mouth every morning.       Marland Kitchen dextromethorphan (DELSYM) 30 MG/5ML liquid Take 30 mg by  mouth as needed for cough.      . ferrous sulfate 325 (65 FE) MG EC tablet Take 325 mg by mouth daily with breakfast.      . furosemide (LASIX) 80 MG tablet Take 80 mg by mouth daily.      Marland Kitchen HYDROcodone-acetaminophen (NORCO/VICODIN) 5-325 MG per tablet Take 1 tablet by mouth 2 (two) times daily.       Marland Kitchen ibuprofen (ADVIL,MOTRIN) 200 MG tablet Take 600 mg by mouth 2 (two) times daily as needed for moderate pain.       Marland Kitchen lansoprazole (PREVACID) 15 MG capsule Take 15 mg by mouth daily as needed (for heartburn).      . meloxicam (MOBIC) 15 MG tablet Take 15 mg by mouth daily.      . travoprost, benzalkonium, (TRAVATAN) 0.004 % ophthalmic solution Place 1 drop into both eyes at bedtime.      . valsartan (DIOVAN) 80 MG tablet Take 1 tablet (80 mg total) by mouth every  morning.  30 tablet  1    Results for orders placed during the hospital encounter of 06/16/2014 (from the past 48 hour(s))  GLUCOSE, CAPILLARY     Status: Abnormal   Collection Time    06/15/14  4:01 PM      Result Value Ref Range   Glucose-Capillary 158 (*) 70 - 99 mg/dL   Comment 1 Notify RN    GLUCOSE, CAPILLARY     Status: Abnormal   Collection Time    06/15/14  8:32 PM      Result Value Ref Range   Glucose-Capillary 177 (*) 70 - 99 mg/dL  GLUCOSE, CAPILLARY     Status: Abnormal   Collection Time    06/16/14  4:01 AM      Result Value Ref Range   Glucose-Capillary 152 (*) 70 - 99 mg/dL  GLUCOSE, CAPILLARY     Status: Abnormal   Collection Time    06/16/14  7:39 AM      Result Value Ref Range   Glucose-Capillary 145 (*) 70 - 99 mg/dL  GLUCOSE, CAPILLARY     Status: Abnormal   Collection Time    06/16/14 12:14 PM      Result Value Ref Range   Glucose-Capillary 172 (*) 70 - 99 mg/dL   Comment 1 Notify RN    BASIC METABOLIC PANEL     Status: Abnormal   Collection Time    06/16/14  2:19 PM      Result Value Ref Range   Sodium 142  137 - 147 mEq/L   Potassium 3.6 (*) 3.7 - 5.3 mEq/L   Chloride 108  96 - 112 mEq/L   CO2 22  19 - 32 mEq/L   Glucose, Bld 180 (*) 70 - 99 mg/dL   BUN 15  6 - 23 mg/dL   Creatinine, Ser 0.59  0.50 - 1.10 mg/dL   Calcium 8.2 (*) 8.4 - 10.5 mg/dL   GFR calc non Af Amer 77 (*) >90 mL/min   GFR calc Af Amer 89 (*) >90 mL/min   Comment: (NOTE)     The eGFR has been calculated using the CKD EPI equation.     This calculation has not been validated in all clinical situations.     eGFR's persistently <90 mL/min signify possible Chronic Kidney     Disease.   Anion gap 12  5 - 15  CBC     Status: Abnormal   Collection Time    06/16/14  3:50 PM      Result Value Ref Range   WBC 3.1 (*) 4.0 - 10.5 K/uL   RBC 3.59 (*) 3.87 - 5.11 MIL/uL   Hemoglobin 9.7 (*) 12.0 - 15.0 g/dL   HCT 29.5 (*) 36.0 - 46.0 %   MCV 82.2  78.0 - 100.0 fL   MCH 27.0  26.0  - 34.0 pg   MCHC 32.9  30.0 - 36.0 g/dL   RDW 15.5  11.5 - 15.5 %   Platelets 362  150 - 400 K/uL  GLUCOSE, CAPILLARY     Status: Abnormal   Collection Time    06/16/14  4:14 PM      Result Value Ref Range   Glucose-Capillary 167 (*) 70 - 99 mg/dL  GLUCOSE, CAPILLARY     Status: Abnormal   Collection Time    06/16/14  9:49 PM      Result Value Ref Range   Glucose-Capillary 153 (*) 70 - 99 mg/dL  GLUCOSE, CAPILLARY     Status: Abnormal   Collection Time    06/17/14 12:28 AM      Result Value Ref Range   Glucose-Capillary 142 (*) 70 - 99 mg/dL   Comment 1 Notify RN    GLUCOSE, CAPILLARY     Status: Abnormal   Collection Time    06/17/14  4:07 AM      Result Value Ref Range   Glucose-Capillary 108 (*) 70 - 99 mg/dL   Comment 1 Notify RN    GLUCOSE, CAPILLARY     Status: Abnormal   Collection Time    06/17/14  7:48 AM      Result Value Ref Range   Glucose-Capillary 124 (*) 70 - 99 mg/dL  BASIC METABOLIC PANEL     Status: Abnormal   Collection Time    06/17/14 10:14 AM      Result Value Ref Range   Sodium 143  137 - 147 mEq/L   Potassium 3.8  3.7 - 5.3 mEq/L   Chloride 109  96 - 112 mEq/L   CO2 21  19 - 32 mEq/L   Glucose, Bld 160 (*) 70 - 99 mg/dL   BUN 17  6 - 23 mg/dL   Creatinine, Ser 0.57  0.50 - 1.10 mg/dL   Calcium 8.2 (*) 8.4 - 10.5 mg/dL   GFR calc non Af Amer 77 (*) >90 mL/min   GFR calc Af Amer 90 (*) >90 mL/min   Comment: (NOTE)     The eGFR has been calculated using the CKD EPI equation.     This calculation has not been validated in all clinical situations.     eGFR's persistently <90 mL/min signify possible Chronic Kidney     Disease.   Anion gap 13  5 - 15  GLUCOSE, CAPILLARY     Status: Abnormal   Collection Time    06/17/14  1:01 PM      Result Value Ref Range   Glucose-Capillary 185 (*) 70 - 99 mg/dL   Dg Abd Portable 1v  06/17/2014   CLINICAL DATA:  Reassess small-bowel obstruction  EXAM: PORTABLE ABDOMEN - 1 VIEW  COMPARISON:  Portable  abdominal films of June 16, 2014  FINDINGS: Again noted are loops of moderately distended gas-filled small bowel in the mid and lower abdomen. There is contrast in the descending colon and rectosigmoid from the CT scan of June 12, 2014. There is sigmoid diverticulosis. There is no evidence of perforation on this single  supine view.  IMPRESSION: Persistent small bowel obstruction without significant change.   Electronically Signed   By: David  [place]   On: 06/17/2014 07:42   Dg Abd Portable 1v  06/16/2014   CLINICAL DATA:  Abdominal pain after eating.  EXAM: PORTABLE ABDOMEN - 1 VIEW  COMPARISON:  Earlier today.  FINDINGS: There has been no significant change in multiple dilated small bowel loops. No nasogastric tube is seen. Lumbar and lower thoracic spine degenerative changes. The previously seen contrast in the colon has moved into the rectum and distal sigmoid colon with multiple diverticula noted.  IMPRESSION: 1. Stable small bowel obstruction. 2. Colonic diverticulosis.   Electronically Signed   By: Enrique Sack M.D.   On: 06/16/2014 20:00   Dg Abd Portable 1v  06/16/2014   CLINICAL DATA:  Reassess small-bowel obstruction  EXAM: PORTABLE ABDOMEN - 1 VIEW  COMPARISON:  Abdominal series of June 15, 2014  FINDINGS: There is a persistent distal small bowel obstruction. The contrast in the right left colon from the August 19th abdominal pelvic CT scan is is little changed. Retrocardiac density on the left is consistent with atelectasis.  IMPRESSION: There is persistent distal small bowel obstruction. There is no evidence of perforation.   Electronically Signed   By: David  [place]   On: 06/16/2014 14:40    ROS: General:no colds or fevers, no weight changes Skin:no rashes or ulcers HEENT:no blurred vision, no congestion CV:see HPI PUL:see HPI GI:no diarrhea constipation or melena, + N & V on admit GU:no hematuria, no dysuria MS:no joint pain, no claudication Neuro:no syncope, no  lightheadedness Endo:no diabetes, no thyroid disease   Blood pressure 146/57, pulse 97, temperature 98.3 F (36.8 C), temperature source Oral, resp. rate 16, height $RemoveBe'5\' 2"'[id number]$  (1.575 m), weight 128 lb 4.9 oz (58.2 kg), SpO2 97.00%. PE: General:Pleasant affect, NAD Skin:Warm and dry, brisk capillary refill HEENT:normocephalic, sclera clear, mucus membranes moist Neck:supple, + JVD, no bruits  Heart:irreg irreg without murmur, gallup, rub or click Lungs: with ant crackles, no rhonchi, or wheezes AVW:UJWJ, non tender, + BS, do not palpate liver spleen or masses Ext:tr lower ext edema seems to be improved, 2+ pedal pulses, 2+ radial pulses Neuro:alert and oriented X 3 with memory drifts at times, MAE, follows commands, + facial symmetry    Assessment/Plan Principal Problem:   Small bowel obstruction- + BMs, clinically better, on clear liquids now. Active Problems:   Atrial fibrillation- paroxysmal rate controlled with cardizem  30 mg every 8 hours and metoprolol 12.5 mg BID.  and  On IV dilt weaning.  Continue rate control.  No anticoagulation, continue asa.   Essential hypertension   Congestive heart failure, echo pending, po lasix resumed after held, pt with SOB, CXR pending.    SBO (small bowel obstruction)   Confusion improving   [facility]  Nurse Practitioner Certified [place] Pager [phone] or after 5pm or weekends call [phone] 06/17/2014, 1:26 PM     Patient seen, examined. Available data reviewed. Agree with findings, assessment, and plan as outlined by Cecilie Kicks, NP. This is a delightful elderly woman in mild respiratory distress with conversation. She was independently interviewed and examined. Heart is irregularly irregular. Lungs with bibasilar rales. Jugular venous pressure is mildly elevated. There is no peripheral edema appreciated. Telemetry reviewed shows atrial fibrillation with a heart rate of approximately 100 beats per minute. Chest  x-ray shows findings consistent with progressive congestive heart failure. See below:  IMPRESSION:  1. Progressive  bibasilar pulmonary alveolar infiltrates and pleural  effusions. Pleural effusion on the right this prominent. These  findings have progressed from prior exam and could be infectious or  related to CHF.  2. Cardiomegaly.  For the patient's atrial fibrillation, recommend continued heart rate control with diltiazem and metoprolol. Will titrate as needed for better heart rate control. The patient has been intolerant to amiodarone in the past. I do not think she is a good candidate for chronic anticoagulation as she is a very frail-appearing 80 year old. Will hold off for now. Note she has not been anticoagulated in the past.  Regarding her acute on chronic diastolic heart failure, I think she needs intravenous diuresis. Her breathing is mildly labored and chest x-ray shows a worsening alveolar infiltrates. Will write for her furosemide 40 mg IV x1 now. We'll need to monitor electrolytes closely. Otherwise continue her current medical therapy. 2-D echocardiogram is currently pending. We'll follow along with you. 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  Sherren Mocha, M.D. 06/17/2014 3:04 PM

## 2014-06-17 NOTE — Progress Notes (Signed)
Patient received dose of 40 mg iv  lasix at 1330.  Dr Excell Seltzer ordered 40 mg iv lasix.  Notified Nada Boozer PA.  Instructed not to give 2nd dose since a dose was already given.

## 2014-06-17 NOTE — Progress Notes (Signed)
Continue clears today. Patient examined and I agree with the assessment and plan  Violeta Gelinas, MD, MPH, FACS Trauma: [phone] General Surgery: [phone]  06/17/2014 5:39 PM

## 2014-06-17 NOTE — Progress Notes (Signed)
Patient had large BM after administration of suppository.

## 2014-06-17 NOTE — Progress Notes (Signed)
[facility] Progress Note     Subjective: Had 3 BM's yesterday after dulcolax.  No N/V, still having abdominal pain.  Family at bedside says shes better, but not all the way improved.  She's more alert and less confused today.  Tolerating some clear liquids.  Objective: Vital signs in last 24 hours: Temp:  [97.5 F (36.4 C)-98.3 F (36.8 C)] 98.3 F (36.8 C) (08/24 0857) Pulse Rate:  [97-108] 97 (08/24 0857) Resp:  [16-18] 16 (08/24 0414) BP: (115-149)/(57-68) 146/57 mmHg (08/24 0857) SpO2:  [94 %-98 %] 97 % (08/24 0414) Weight:  [128 lb 4.9 oz (58.2 kg)] 128 lb 4.9 oz (58.2 kg) (08/24 0414) Last BM Date: 06/16/14  Intake/Output from previous day: 08/23 0701 - 08/24 0700 In: 360 [P.O.:300; I.V.:60] Out: 125 [Urine:125] Intake/Output this shift:    PE: Gen: Alert, but noticeably confused (oriented to name and that I'm at the hospital), NAD, pleasant  Abd: Soft, mild distension, mildly tender to palpation in the central abdomen, few BS, no HSM   Lab Results:   Recent Labs  07-13-2014 0148 06/16/14 1550  WBC 3.1* 3.1*  HGB 10.0* 9.7*  HCT 29.9* 29.5*  PLT 323 362   BMET  Recent Labs  07-13-2014 0148 06/16/14 1419  NA 143 142  K 3.4* 3.6*  CL 107 108  CO2 17* 22  GLUCOSE 159* 180*  BUN 20 15  CREATININE 0.65 0.59  CALCIUM 8.3* 8.2*   PT/INR No results found for this basename: LABPROT, INR,  in the last 72 hours CMP     Component Value Date/Time   NA 142 06/16/2014 1419   K 3.6* 06/16/2014 1419   CL 108 06/16/2014 1419   CO2 22 06/16/2014 1419   GLUCOSE 180* 06/16/2014 1419   BUN 15 06/16/2014 1419   CREATININE 0.59 06/16/2014 1419   CALCIUM 8.2* 06/16/2014 1419   PROT 5.3* 07/13/2014 0148   ALBUMIN 2.4* 07/13/14 0148   AST 15 2014-07-13 0148   ALT 6 Jul 13, 2014 0148   ALKPHOS 36* 2014/07/13 0148   BILITOT 0.4 2014/07/13 0148   GFRNONAA 77* 06/16/2014 1419   GFRAA 89* 06/16/2014 1419   Lipase     Component Value Date/Time   LIPASE 13 06/02/2014  1547       Studies/Results: Dg Abd 2 Views  07-13-2014   CLINICAL DATA:  Lower abdominal pain  EXAM: ABDOMEN - 2 VIEW  COMPARISON:  Prior acute abdominal series 06/14/14  FINDINGS: On the right lateral decubitus view there is an ovoid collection of air along the right abdominal wall. There is no evidence of free air on the upright view. In the region of this same air there was a loop of bowel on the prior study. On the frontal view, there is a similar degree of gaseous distention of multiple loops of small bowel throughout the abdomen. Continued distal progression of contrast material through the colon and into the rectum. The bones appear osteopenic. Multilevel degenerative disc disease.  IMPRESSION: 1. Ovoid collection of air without bowel markings on the lateral decubitus view could not be replicated on the upright view. There was a loop of bowel in the same location on the prior acute abdominal series. I favor this to represent increased dilatation of this loop with a air layering in the bowel lumen rather than true free air. 2. Similar degree of a multifocal loops of dilated small bowel throughout the abdomen. Small bowel there is continued distal progression of oral contrast material through  the colon.   Electronically Signed   By: Malachy Moan M.D.   On: 06/15/2014 13:44   Dg Abd Portable 1v  06/17/2014   CLINICAL DATA:  Reassess small-bowel obstruction  EXAM: PORTABLE ABDOMEN - 1 VIEW  COMPARISON:  Portable abdominal films of June 16, 2014  FINDINGS: Again noted are loops of moderately distended gas-filled small bowel in the mid and lower abdomen. There is contrast in the descending colon and rectosigmoid from the CT scan of 06/14/2014. There is sigmoid diverticulosis. There is no evidence of perforation on this single supine view.  IMPRESSION: Persistent small bowel obstruction without significant change.   Electronically Signed   By: David  [place]   On: 06/17/2014 07:42   Dg Abd  Portable 1v  06/16/2014   CLINICAL DATA:  Abdominal pain after eating.  EXAM: PORTABLE ABDOMEN - 1 VIEW  COMPARISON:  Earlier today.  FINDINGS: There has been no significant change in multiple dilated small bowel loops. No nasogastric tube is seen. Lumbar and lower thoracic spine degenerative changes. The previously seen contrast in the colon has moved into the rectum and distal sigmoid colon with multiple diverticula noted.  IMPRESSION: 1. Stable small bowel obstruction. 2. Colonic diverticulosis.   Electronically Signed   By: Gordan Payment M.D.   On: 06/16/2014 20:00   Dg Abd Portable 1v  06/16/2014   CLINICAL DATA:  Reassess small-bowel obstruction  EXAM: PORTABLE ABDOMEN - 1 VIEW  COMPARISON:  Abdominal series of June 15, 2014  FINDINGS: There is a persistent distal small bowel obstruction. The contrast in the right left colon from the August 19th abdominal pelvic CT scan is is little changed. Retrocardiac density on the left is consistent with atelectasis.  IMPRESSION: There is persistent distal small bowel obstruction. There is no evidence of perforation.   Electronically Signed   By: David  [place]   On: 06/16/2014 14:40    Anti-infectives: Anti-infectives   None       Assessment/Plan SBO  Confustion - improved  Plan:  1. Films not much improved, but clinically she seems better 2. Await better bowel function prior to advancing diet (may still be obstructed and just having BM's below the obstruction) 3. Tolerating clear liquids, but would not advance yet 4. Ambulate and IS  5. SCD's and lovenox  6. Repeat dulcolax today 7. Last CT on 06/13/14 - may need to repeat at some point (thursday would be 1 week)    LOS: 5 days    [place], [facility] 06/17/2014, 9:34 AM Pager: [phone]

## 2014-06-17 NOTE — Progress Notes (Signed)
[facility] PROGRESS NOTE  Paula Nash:[id number] DOB: 20-Sep-1919 DOA: Jun 27, 2014 PCP: Burtis Junes, MD Interim summary: Paula Nash is a 80 y.o. female with history of atrial fibrillation not on anticoagulants, hypertension, CHF, arthritis was brought to the ER after patient was having nausea vomiting and abdominal pain and weakness. In the ER CT of the abdomen and pelvis shows features concerning for small bowel obstruction. Patient was admitted for further management. Surgery consulted. Her abd film showed mild improvement and clear liquid diet started. She is able to tolerate the clear liquid diet. She developed a fib with RVR the night of 8/22and she was started on cardizem drip. As per the daughter, patient was on amiodarone is the past and could not tolerate it. She was supposed to see Dr Katrinka Blazing last year and could not keep the appointment. She is not on anticoagulation for unknown reasons, ? Falls . She is on aspirin 81 mg daily. Her rate is better controlled today and we have transitioned cardizem drip to po cardizem and added metoprolol. We will call cardiology to see if she needs to be on anti coagulation. Echocardiogram ordered.   Assessment/Plan: Small bowel obstruction: Admitted to telemetry.  Repeat abd films show improvement on 8/22. Started on clears by surgery. Continue to monitor.  3 BM yesterday. She had a suppository today    Hypokalemia  Replete as needed.   Generalized weakness; PT/OT EVAL when stable.   Anemia: Stable.   Hypertension: Suboptimal.   Atrial fibrillation with RVR: Rate better controlled today. She  was started on cardizem drip, later on transitioned to po cardizem. Echocardiogram ordered.she denies any chest pain at this time. She is not any anticoagulation in the past for unclear reasons. Cardiology consulted for further recommendations.   She had shortness of breath earlier today , will get CXR. She was also on lasix  at home which was initially held for borderline BP parameters. We will restart it today.      Code Status: full code. Confirmed today.  Family Communication:daughter over the phone Disposition Plan: SNF when stable.    Consultants:  Surgery.  cardiology  Procedures:  Acute abdomen  Antibiotics:  none  HPI/Subjective: She reports nausea, abdo pain and headache, repeat film ordered.   Objective: Filed Vitals:   06/17/14 0414  BP: 115/65  Pulse: 97  Temp: 97.6 F (36.4 C)  Resp: 16    Intake/Output Summary (Last 24 hours) at 06/17/14 0816 Last data filed at 06/16/14 1752  Gross per 24 hour  Intake    360 ml  Output    125 ml  Net    235 ml   Filed Weights   06/15/14 0540 06/16/14 0640 06/17/14 0414  Weight: 55.2 kg (121 lb 11.1 oz) 57 kg (125 lb 10.6 oz) 58.2 kg (128 lb 4.9 oz)    Exam:   General:  Alert afebrile in mild distress.   Cardiovascular: s1s2, irregular.   Respiratory: decreased air entry bases. No wheezing heard  Abdomen: soft mILD tenderness generalized. Bowel sounds heard.   Musculoskeletal: trace edema in the lower extremities.   Data Reviewed: Basic Metabolic Panel:  Recent Labs Lab 06/27/2014 1547 06/13/14 0503 06/14/14 1030 06/15/14 0148 06/16/14 1419  NA 141 143 144 143 142  K 3.8 3.3* 3.3* 3.4* 3.6*  CL 103 106 107 107 108  CO2 17* 22  GLUCOSE 188* 125* 93 159* 180*  BUN CREATININE 0.63 0.67  0.68 0.65 0.59  CALCIUM 9.3 8.5 8.3* 8.3* 8.2*  MG  --   --   --  2.0  --    Liver Function Tests:  Recent Labs Lab 06/20/2014 1547 06/13/14 0503 06/15/14 0148  AST ALT ALKPHOS 46 38* 36*  BILITOT 0.5 0.4 0.4  PROT 6.7 5.6* 5.3*  ALBUMIN 3.4* 2.8* 2.4*    Recent Labs Lab 06/03/2014 1547  LIPASE 13   No results found for this basename: AMMONIA,  in the last 168 hours CBC:  Recent Labs Lab 06/05/2014 1547 06/13/14 0503 06/15/14 0148 06/16/14 1550  WBC 8.8 7.9 3.1* 3.1*   NEUTROABS 7.7 5.3 1.1*  --   HGB 10.0* 9.6* 10.0* 9.7*  HCT 30.3* 28.6* 29.9* 29.5*  MCV 82.1 82.2 82.4 82.2  PLT 341 327 323 362   Cardiac Enzymes: No results found for this basename: CKTOTAL, CKMB, CKMBINDEX, TROPONINI,  in the last 168 hours BNP (last 3 results)  Recent Labs  05/26/2014 1547  PROBNP 1364.0*   CBG:  Recent Labs Lab 06/16/14 1614 06/16/14 2149 06/17/14 0028 06/17/14 0407 06/17/14 0748  GLUCAP 167* 153* 142* 108* 124*    No results found for this or any previous visit (from the past 240 hour(s)).   Studies: Dg Abd 2 Views  06/15/2014   CLINICAL DATA:  Lower abdominal pain  EXAM: ABDOMEN - 2 VIEW  COMPARISON:  Prior acute abdominal series 06/14/14  FINDINGS: On the right lateral decubitus view there is an ovoid collection of air along the right abdominal wall. There is no evidence of free air on the upright view. In the region of this same air there was a loop of bowel on the prior study. On the frontal view, there is a similar degree of gaseous distention of multiple loops of small bowel throughout the abdomen. Continued distal progression of contrast material through the colon and into the rectum. The bones appear osteopenic. Multilevel degenerative disc disease.  IMPRESSION: 1. Ovoid collection of air without bowel markings on the lateral decubitus view could not be replicated on the upright view. There was a loop of bowel in the same location on the prior acute abdominal series. I favor this to represent increased dilatation of this loop with a air layering in the bowel lumen rather than true free air. 2. Similar degree of a multifocal loops of dilated small bowel throughout the abdomen. Small bowel there is continued distal progression of oral contrast material through the colon.   Electronically Signed   By: Malachy Moan M.D.   On: 06/15/2014 13:44   Dg Abd Portable 1v  06/17/2014   CLINICAL DATA:  Reassess small-bowel obstruction  EXAM: PORTABLE ABDOMEN -  1 VIEW  COMPARISON:  Portable abdominal films of June 16, 2014  FINDINGS: Again noted are loops of moderately distended gas-filled small bowel in the mid and lower abdomen. There is contrast in the descending colon and rectosigmoid from the CT scan of June 12, 2014. There is sigmoid diverticulosis. There is no evidence of perforation on this single supine view.  IMPRESSION: Persistent small bowel obstruction without significant change.   Electronically Signed   By: David  [place]   On: 06/17/2014 07:42   Dg Abd Portable 1v  06/16/2014   CLINICAL DATA:  Abdominal pain after eating.  EXAM: PORTABLE ABDOMEN - 1 VIEW  COMPARISON:  Earlier today.  FINDINGS: There has been no significant change in multiple dilated small bowel loops.  No nasogastric tube is seen. Lumbar and lower thoracic spine degenerative changes. The previously seen contrast in the colon has moved into the rectum and distal sigmoid colon with multiple diverticula noted.  IMPRESSION: 1. Stable small bowel obstruction. 2. Colonic diverticulosis.   Electronically Signed   By: Gordan Payment M.D.   On: 06/16/2014 20:00   Dg Abd Portable 1v  06/16/2014   CLINICAL DATA:  Reassess small-bowel obstruction  EXAM: PORTABLE ABDOMEN - 1 VIEW  COMPARISON:  Abdominal series of June 15, 2014  FINDINGS: There is a persistent distal small bowel obstruction. The contrast in the right left colon from the August 19th abdominal pelvic CT scan is is little changed. Retrocardiac density on the left is consistent with atelectasis.  IMPRESSION: There is persistent distal small bowel obstruction. There is no evidence of perforation.   Electronically Signed   By: David  [place]   On: 06/16/2014 14:40    Scheduled Meds: . antiseptic oral rinse  7 mL Mouth Rinse BID  . enoxaparin (LOVENOX) injection  30 mg Subcutaneous Q24H  . pantoprazole (PROTONIX) IV  40 mg Intravenous Q24H  . sodium chloride  3 mL Intravenous Q12H  . sodium chloride  3 mL Intravenous Q12H  .  Travoprost (BAK Free)  1 drop Both Eyes QHS   Continuous Infusions: . diltiazem (CARDIZEM) infusion 5 mg/hr (06/16/14 1630)    Principal Problem:   Small bowel obstruction Active Problems:   Atrial fibrillation   Essential hypertension   Congestive heart disease   SBO (small bowel obstruction)    Time spent: 30 minutes.     [facility]  [facility] Pager [phone]. If 7PM-7AM, please contact night-coverage at [email or web address], password [facility] 06/17/2014, 8:16 AM  LOS: 5 days

## 2014-06-18 ENCOUNTER — Inpatient Hospital Stay (HOSPITAL_COMMUNITY): Payer: Medicare Other

## 2014-06-18 DIAGNOSIS — I059 Rheumatic mitral valve disease, unspecified: Secondary | ICD-10-CM

## 2014-06-18 LAB — BASIC METABOLIC PANEL
ANION GAP: 13 (ref 5–15)
BUN: 17 mg/dL (ref 6–23)
CHLORIDE: 108 meq/L (ref 96–112)
CO2: 22 mEq/L (ref 19–32)
CREATININE: 0.61 mg/dL (ref 0.50–1.10)
Calcium: 8.5 mg/dL (ref 8.4–10.5)
GFR calc non Af Amer: 76 mL/min — ABNORMAL LOW (ref >90)
GFR, EST AFRICAN AMERICAN: 88 mL/min — AB (ref >90)
Glucose, Bld: 154 mg/dL — ABNORMAL HIGH (ref 70–99)
Potassium: 3.5 mEq/L — ABNORMAL LOW (ref 3.7–5.3)
Sodium: 143 mEq/L (ref 137–147)

## 2014-06-18 LAB — GLUCOSE, CAPILLARY
GLUCOSE-CAPILLARY: 153 mg/dL — AB (ref 70–99)
GLUCOSE-CAPILLARY: 157 mg/dL — AB (ref 70–99)
Glucose-Capillary: 149 mg/dL — ABNORMAL HIGH (ref 70–99)
Glucose-Capillary: 159 mg/dL — ABNORMAL HIGH (ref 70–99)
Glucose-Capillary: 190 mg/dL — ABNORMAL HIGH (ref 70–99)

## 2014-06-18 MED ORDER — ENSURE COMPLETE PO LIQD
237.0000 mL | Freq: Three times a day (TID) | ORAL | Status: DC
  Administered 2014-06-18 (×2): 237 mL via ORAL

## 2014-06-18 MED ORDER — POTASSIUM CHLORIDE 10 MEQ/100ML IV SOLN
10.0000 meq | INTRAVENOUS | Status: AC
  Administered 2014-06-18 (×3): 10 meq via INTRAVENOUS
  Filled 2014-06-18 (×3): qty 100

## 2014-06-18 MED ORDER — BISACODYL 10 MG RE SUPP
10.0000 mg | Freq: Once | RECTAL | Status: AC
  Administered 2014-06-18: 10 mg via RECTAL
  Filled 2014-06-18: qty 1

## 2014-06-18 MED ORDER — PANTOPRAZOLE SODIUM 40 MG PO TBEC
40.0000 mg | DELAYED_RELEASE_TABLET | Freq: Every day | ORAL | Status: DC
Start: 2014-06-19 — End: 2014-06-19

## 2014-06-18 MED ORDER — PROMETHAZINE HCL 25 MG/ML IJ SOLN
12.5000 mg | Freq: Four times a day (QID) | INTRAMUSCULAR | Status: DC | PRN
  Administered 2014-06-18: 12.5 mg via INTRAVENOUS
  Filled 2014-06-18: qty 1

## 2014-06-18 MED ORDER — METOCLOPRAMIDE HCL 5 MG/ML IJ SOLN
5.0000 mg | Freq: Three times a day (TID) | INTRAMUSCULAR | Status: DC
  Administered 2014-06-18: 5 mg via INTRAVENOUS
  Filled 2014-06-18 (×6): qty 1

## 2014-06-18 NOTE — Progress Notes (Signed)
Patient ID: Paula Nash, female   DOB: 1919/03/11, 80 y.o.   MRN: [id number] [facility] PROGRESS NOTE  FE OKUBO WUJ:[id number] DOB: 1919-10-11 DOA: 06-30-2014 PCP: Burtis Junes, MD  Brief narrative: 80 y.o. female with history of atrial fibrillation not on anticoagulants, hypertension, CHF, arthritis who presented to [facility] 2014/06/30 with abdominal pain, nausea, vomiting, weakness. She was found to have small bowel obstruction. Hospital course is complicated with development of Afib with RVR on 06/15/2014. Cardiology consulted.  Assessment/Plan:    Principal Problem:  Small bowel obstruction  Repeat abdominal x rays show persistent SBO although abdominal distention is better. She continues to have nausea and vomiting with clear liquids. Added phenergan for intractable nausea and vomiting.   Check abdominal X ray today.  Added reglan 5 mg IV every 8 hours as well for promotility.  Continue protonix 40 mg Q 12 hours   Replete potassium today.  Active Problems: Hypokalemia   Secondary to GI losses  Being repleted  Follow up BMP in am Generalized weakness  Will go to SNF on discharge per PT recommendations .  Hypertension  Continue Cardizem, lasix and metoprolol   Atrial fibrillation with RVR / Acute systolic CHF  Heart rate controlled with cardizem and metoprolol. She did require cardizem drip initially.   2 D ECHO on this admission showed EF of 30%.  Appreciate cardiology following  Not on AC due to risk of falls.   In regards to CHF, pt is on lasix 40 mg PO BID (need to monitor BP carefully). She is at risk of dehydration since she is not eating much due to SBO. CXR showed worsening pulmonary alveolar infiltrates and pleural effusion right >> left. Per cardio will continue current lasix PO  DVT Prophylaxis   Lovenox subQ while pt is in hospital    Code Status: Full.  Family Communication:  plan of care discussed with the patient's  daughter at the bedside Disposition Plan: to SNF when stable.    IV Access:   Peripheral IV Procedures and diagnostic studies:    Dg Chest Port 1 View 07-05-2014    1. Progressive bibasilar pulmonary alveolar infiltrates and pleural effusions. Pleural effusion on the right this prominent. These findings have progressed from prior exam and could be infectious or related to CHF. 2. Cardiomegaly.     Dg Abd Portable 1v 07/05/14  Persistent small bowel obstruction without significant change.     Dg Abd Portable 1v  06/16/2014  Stable small bowel obstruction. 2. Colonic diverticulosis.     Dg Abd Portable 1v 06/16/2014 There is persistent distal small bowel obstruction. There is no evidence of perforation.     Medical Consultants:   Surgery Cardiology  Other Consultants:   None  Anti-Infectives:   None    Manson Passey, MD  [facility] Pager [phone]  If 7PM-7AM, please contact night-coverage [email or web address] Password [facility] 06/18/2014, 2:27 PM   LOS: 6 days    HPI/Subjective: No acute overnight events.  Objective: Filed Vitals:   06/18/14 0430 06/18/14 0757 06/18/14 1105 06/18/14 1359  BP: 131/59 138/56 122/85 119/72  Pulse: 100 85  87  Temp: 98.1 F (36.7 C) 97.3 F (36.3 C) 98.2 F (36.8 C) 99.2 F (37.3 C)  TempSrc: Oral Oral Oral Axillary  Resp: Height:      Weight: 57.1 kg (125 lb 14.1 oz) 57.698 kg (127 lb 3.2 oz)    SpO2: 96% 99%  98%    Intake/Output  Summary (Last 24 hours) at 06/18/14 1427 Last data filed at 06/18/14 1341  Gross per 24 hour  Intake    360 ml  Output      0 ml  Net    360 ml    Exam:   General:  Pt is alert, follows commands appropriately, not in acute distress  Cardiovascular: Regular rate and rhythm, S1/S2 appreciated, (+) JVP  Respiratory: diminished at bases, no wheezing   Abdomen: distended, bowel sounds present  Extremities: Pulses DP and PT palpable bilaterally  Neuro: Grossly nonfocal  Data  Reviewed: Basic Metabolic Panel:  Recent Labs Lab 06/14/14 1030 06/15/14 0148 06/16/14 1419 06/17/14 1014 06/18/14 0449  NA 144 143 142 143 143  K 3.3* 3.4* 3.6* 3.8 3.5*  CL 107 107 108 109 108  CO2 20 17* GLUCOSE 93 159* 180* 160* 154*  BUN CREATININE 0.68 0.65 0.59 0.57 0.61  CALCIUM 8.3* 8.3* 8.2* 8.2* 8.5  MG  --  2.0  --   --   --    Liver Function Tests:  Recent Labs Lab June 19, 2014 1547 06/13/14 0503 06/15/14 0148  AST ALT ALKPHOS 46 38* 36*  BILITOT 0.5 0.4 0.4  PROT 6.7 5.6* 5.3*  ALBUMIN 3.4* 2.8* 2.4*    Recent Labs Lab 2014/06/19 1547  LIPASE 13   No results found for this basename: AMMONIA,  in the last 168 hours CBC:  Recent Labs Lab Jun 19, 2014 1547 06/13/14 0503 06/15/14 0148 06/16/14 1550  WBC 8.8 7.9 3.1* 3.1*  NEUTROABS 7.7 5.3 1.1*  --   HGB 10.0* 9.6* 10.0* 9.7*  HCT 30.3* 28.6* 29.9* 29.5*  MCV 82.1 82.2 82.4 82.2  PLT 341 327 323 362   Cardiac Enzymes: No results found for this basename: CKTOTAL, CKMB, CKMBINDEX, TROPONINI,  in the last 168 hours BNP: No components found with this basename: POCBNP,  CBG:  Recent Labs Lab 06/17/14 2013 06/18/14 0016 06/18/14 0429 06/18/14 0715 06/18/14 1114  GLUCAP 145* 149* 153* 159* 157*    No results found for this or any previous visit (from the past 240 hour(s)).   Scheduled Meds: . aspirin  81 mg Oral Daily  . diltiazem  30 mg Oral 3 times per day  . enoxaparin (LOVENOX) injection  30 mg Subcutaneous Q24H  . feeding supplement (ENSURE COMPLETE)  237 mL Oral TID WC  . feeding supplement (RESOURCE BREEZE)  1 Container Oral TID WC  . furosemide  40 mg Oral BID  . metoprolol tartrate  12.5 mg Oral BID  . [START ON 06/19/2014] pantoprazole  40 mg Oral Q1200  . potassium chloride  10 mEq Intravenous Q1 Hr x 3

## 2014-06-18 NOTE — Clinical Documentation Improvement (Signed)
PLEASE NOTE IF PRESENT ON ADMISSION  Possible Clinical Conditions?  1. Please specify location of pressure ulcer:  Body site(s) involved  Laterality (bilateral, left, right)  Other (please specify) _______________  Unable to determine  Unknown  2. Please specify POA status of each pressure ulcer:  Not present on admission  Present on admission  Other (please specify) _________________   Wound Nurse Assessment (WOC): 06/14/14 consult note Continue silicone foam implemented by the skin care protocols, change every 3 days. Turn and reposition every 2 hours. May want to provide zinc based barrier ointment for use at home.  Nursing staff document in doc flowsheet   Thank You, Andy Gauss ,RN Clinical Documentation Specialist:  [phone]  [facility]- Health Information Management

## 2014-06-18 NOTE — Progress Notes (Signed)
Spoke with oncoming RN about patient's plan. NG tube will be placed by oncoming RN and charge RN. Contacted patient's daughter Doristine Bosworth and spoke with her about patient receiving NG tube and she agreed to patient receiving NG tube. Also called and spoke with French Ana from Radiology about pt.getting x-rays. Radiology tech called around 4pm this afternoon but patient was receiving 3 runs of IV Potassium. Asked French Ana if pt.could be taken down for x-ray at 9 pm after NG tube was placed and she agreed.

## 2014-06-18 NOTE — Progress Notes (Signed)
UR completed Alanta Scobey K. Javohn Basey, RN, BSN, MSHL, CCM  06/18/2014 5:34 PM

## 2014-06-18 NOTE — Progress Notes (Signed)
Continuous nausea and vomiting. Order placed for NG tube insertion. Manson Passey [facility] [phone]

## 2014-06-18 NOTE — Clinical Social Work Psychosocial (Addendum)
Clinical Social Work Department BRIEF PSYCHOSOCIAL ASSESSMENT 06/17/2014  Patient:  Paula Nash, Paula Nash     Account Number:  [id number]     Admit date:  06/23/2014  Clinical Social Worker:  Iona Coach  Date/Time:  06/17/2014 10:30 AM  Referred by:  Physician  Date Referred:  06/14/2014 Referred for  SNF Placement   Other Referral:   Interview type:  Other - See comment Other interview type:   Patent and daughter Paula Nash -Daughter    PSYCHOSOCIAL DATA Living Status:  ALONE Admitted from facility:   Level of care:   Primary support name:  Paula Nash  [phone] or [phone] Primary support relationship to patient:  CHILD, ADULT Degree of support available:   Strong support    CURRENT CONCERNS Current Concerns  Post-Acute Placement   Other Concerns:    SOCIAL WORK ASSESSMENT / PLAN 80 - year old female referred for SNF placement- short term for rehab.  CSW met with patient who is willing to consider SNF placement but wants her daughter Paula Nash involved in the decision making.  CSW spoke to daughter who is agreeable to placement and feels that patient may actually require long term placement as her care has become more heavy at home. Daughter agrees to SNF placement; Fl2 will be initiated and placed on chart for MD's signature.   Assessment/plan status:   Other assessment/ plan:   Information/referral to community resources:   SNF bed list placed in patient's room    PATIENT'S/FAMILY'S RESPONSE TO PLAN OF CARE: Patient is alert and oriented at least to person and place. She is agreeable to SNF placement as long as her daughter is agreeable.  She was pleasant and agreeable to work with CSW on placement- does not have any placement preference. Patient's daughter Paula Nash is agree to SNF search and asked many appropriate questions.  CSW will assist patient /daughter with placement when medically stable.

## 2014-06-18 NOTE — Progress Notes (Addendum)
Paged Mid-level on call with [facility] to see if patient's diet order can be changed from full liquid since she now has orders for NG tube. Notified night nurse.

## 2014-06-18 NOTE — Progress Notes (Signed)
  Echocardiogram 2D Echocardiogram has been performed.  Arvil Chaco 06/18/2014, 9:06 AM

## 2014-06-18 NOTE — Progress Notes (Signed)
Pt.is A/Ox3. She has had no c/o pain. Pt.has had c/o nausea and with two episodes of vomiting. Dr. Verlon Setting was paged and notified with each episode. Pt.had received Zofran po prn once for c/o nausea. Pt.was still having c/o nausea so Dr.Magick-Devine was notified and new order written. Pt.received IV phenergan. Was notified less than an hour later by the student nurse and nursing instructor that patient had another episode of vomiting. It was reported to me that emesis was 200 mL, light brown, and had a fecal smelling odor. Paged the MD again to notify. New order was written for Reglan IV. Reglan IV was given. Pt.had c/o nausea with vomiting again around 1828 with a small amount of emesis that was light brown. Dr.Magick-Devine paged and notified again. New orders written for gastric tube to be placed. Will notify oncoming nurse of plan.

## 2014-06-18 NOTE — Progress Notes (Signed)
[facility] Progress Note     Subjective: Pt says she feels better.  Had 2 more BM's yesterday.  No N/V.  RN's said she ate 30% of her breakfast tray.  She's wanting to try something more.  Still hurts some in her abdomen.    Objective: Vital signs in last 24 hours: Temp:  [97.3 F (36.3 C)-98.5 F (36.9 C)] 97.3 F (36.3 C) (08/25 0757) Pulse Rate:  [50-104] 85 (08/25 0757) Resp:  [16-22] 22 (08/25 0757) BP: (116-153)/(53-63) 138/56 mmHg (08/25 0757) SpO2:  [94 %-99 %] 99 % (08/25 0757) Weight:  [125 lb 14.1 oz (57.1 kg)-127 lb 3.2 oz (57.698 kg)] 127 lb 3.2 oz (57.698 kg) (08/25 0757) Last BM Date: 06/17/14  Intake/Output from previous day: 08/24 0701 - 08/25 0700 In: 440 [P.O.:440] Out: -  Intake/Output this shift: Total I/O In: 160 [P.O.:160] Out: -   PE: Gen:  Alert, NAD, pleasant, a little confused today Abd: Soft, mild distension, mildly tender to palpation in the central abdomen, few BS, no HSM   Lab Results:   Recent Labs  06/16/14 1550  WBC 3.1*  HGB 9.7*  HCT 29.5*  PLT 362   BMET  Recent Labs  06/17/14 1014 06/18/14 0449  NA 143 143  K 3.8 3.5*  CL 109 108  CO2 21 22  GLUCOSE 160* 154*  BUN 17 17  CREATININE 0.57 0.61  CALCIUM 8.2* 8.5   PT/INR No results found for this basename: LABPROT, INR,  in the last 72 hours CMP     Component Value Date/Time   NA 143 06/18/2014 0449   K 3.5* 06/18/2014 0449   CL 108 06/18/2014 0449   CO2 22 06/18/2014 0449   GLUCOSE 154* 06/18/2014 0449   BUN 17 06/18/2014 0449   CREATININE 0.61 06/18/2014 0449   CALCIUM 8.5 06/18/2014 0449   PROT 5.3* 06/15/2014 0148   ALBUMIN 2.4* 06/15/2014 0148   AST 15 06/15/2014 0148   ALT 6 06/15/2014 0148   ALKPHOS 36* 06/15/2014 0148   BILITOT 0.4 06/15/2014 0148   GFRNONAA 76* 06/18/2014 0449   GFRAA 88* 06/18/2014 0449   Lipase     Component Value Date/Time   LIPASE 13 05/29/2014 1547       Studies/Results: Dg Chest Port 1 View  06/17/2014   CLINICAL  DATA:  Shortness of breath.  EXAM: PORTABLE CHEST - 1 VIEW  COMPARISON:  06/14/2014.  FINDINGS: Mediastinum and hilar structures are unremarkable. Cardiomegaly. Progressive bibasilar infiltrates and pleural effusions are noted. These changes could be related to basilar pneumonia and/or congestive heart failure with pulmonary edema. Prominent skin fold on the left. No pneumothorax. No acute osseous abnormality.  IMPRESSION: 1. Progressive bibasilar pulmonary alveolar infiltrates and pleural effusions. Pleural effusion on the right this prominent. These findings have progressed from prior exam and could be infectious or related to CHF. 2. Cardiomegaly.   Electronically Signed   By: Maisie Fus  Register   On: 06/17/2014 14:48   Dg Abd Portable 1v  06/17/2014   CLINICAL DATA:  Reassess small-bowel obstruction  EXAM: PORTABLE ABDOMEN - 1 VIEW  COMPARISON:  Portable abdominal films of June 16, 2014  FINDINGS: Again noted are loops of moderately distended gas-filled small bowel in the mid and lower abdomen. There is contrast in the descending colon and rectosigmoid from the CT scan of June 12, 2014. There is sigmoid diverticulosis. There is no evidence of perforation on this single supine view.  IMPRESSION: Persistent small bowel obstruction  without significant change.   Electronically Signed   By: David  [place]   On: 06/17/2014 07:42   Dg Abd Portable 1v  06/16/2014   CLINICAL DATA:  Abdominal pain after eating.  EXAM: PORTABLE ABDOMEN - 1 VIEW  COMPARISON:  Earlier today.  FINDINGS: There has been no significant change in multiple dilated small bowel loops. No nasogastric tube is seen. Lumbar and lower thoracic spine degenerative changes. The previously seen contrast in the colon has moved into the rectum and distal sigmoid colon with multiple diverticula noted.  IMPRESSION: 1. Stable small bowel obstruction. 2. Colonic diverticulosis.   Electronically Signed   By: Gordan Payment M.D.   On: 06/16/2014 20:00   Dg Abd  Portable 1v  06/16/2014   CLINICAL DATA:  Reassess small-bowel obstruction  EXAM: PORTABLE ABDOMEN - 1 VIEW  COMPARISON:  Abdominal series of June 15, 2014  FINDINGS: There is a persistent distal small bowel obstruction. The contrast in the right left colon from the August 19th abdominal pelvic CT scan is is little changed. Retrocardiac density on the left is consistent with atelectasis.  IMPRESSION: There is persistent distal small bowel obstruction. There is no evidence of perforation.   Electronically Signed   By: David  [place]   On: 06/16/2014 14:40    Anti-infectives: Anti-infectives   None       Assessment/Plan SBO  Confustion - improved   Plan:  1. Films not much improved over the last few days, but clinically she seems better, had 2 more large BM's yesterday with suppository 2. Will try advancing to fulls today 3. Ambulate and IS  4. SCD's and lovenox  5.  Repeat dulcolax today  6.  Last CT on 06/13/14 - may need to repeat at some point (thursday would be 1 week)      LOS: 6 days    [name] 06/18/2014, 8:36 AM Pager: [phone]

## 2014-06-18 NOTE — Progress Notes (Signed)
Patient Name: Paula Nash Date of Encounter: 06/18/2014  Principal Problem:   Small bowel obstruction Active Problems:   Atrial fibrillation   Essential hypertension   Congestive heart disease   SBO (small bowel obstruction)    Patient Profile: 80 yo female w/ hx PAF, no anticoag 2nd fall risk, HTN, D-CHF, was admitted 08/20 w/ N&V, abd pain 2nd SBO. Cards saw her 08/24 for atrial fib and CHF.  SUBJECTIVE: Still very SOB, using a bedpan. No chest pain or palpitations.  OBJECTIVE Filed Vitals:   06/17/14 2037 06/18/14 0430 06/18/14 0757 06/18/14 1105  BP: 125/63 131/59 138/56 122/85  Pulse: 87 100 85   Temp:  98.1 F (36.7 C) 97.3 F (36.3 C) 98.2 F (36.8 C)  TempSrc: Oral Oral Oral Oral  Resp: Height:      Weight:  125 lb 14.1 oz (57.1 kg) 127 lb 3.2 oz (57.698 kg)   SpO2: 94% 96% 99%     Intake/Output Summary (Last 24 hours) at 06/18/14 1256 Last data filed at 06/18/14 0800  Gross per 24 hour  Intake    360 ml  Output      0 ml  Net    360 ml   Filed Weights   06/17/14 0414 06/18/14 0430 06/18/14 0757  Weight: 128 lb 4.9 oz (58.2 kg) 125 lb 14.1 oz (57.1 kg) 127 lb 3.2 oz (57.698 kg)    PHYSICAL EXAM General: Well developed, well nourished, female in no acute distress. Head: Normocephalic, atraumatic.  Neck: Supple without bruits, JVD and JVP elevated Lungs:  Resp regular and slightly labored, decreased BS bases, ?bronchial BS. Heart: Irregular at times, S1, S2, no S3, S4, or murmur; no rub. Abdomen: Firm, tender, distended, BS not heard.  Extremities: No clubbing, cyanosis, no LE edema.  Neuro: Alert and oriented X 3. Moves all extremities spontaneously. Psych: Normal affect.  LABS: CBC: Recent Labs  06/16/14 1550  WBC 3.1*  HGB 9.7*  HCT 29.5*  MCV 82.2  PLT 362   Basic Metabolic Panel: Recent Labs  06/17/14 1014 06/18/14 0449  NA 143 143  K 3.8 3.5*  CL 109 108  CO2 21 22  GLUCOSE 160* 154*  BUN 17 17    CREATININE 0.57 0.61  CALCIUM 8.2* 8.5   BNP: Pro B Natriuretic peptide (BNP)  Date/Time Value Ref Range Status  07/04/14  3:47 PM 1364.0* 0 - 450 pg/mL Final    TELE:     Radiology/Studies: Dg Chest Port 1 View 06/17/2014   CLINICAL DATA:  Shortness of breath.  EXAM: PORTABLE CHEST - 1 VIEW  COMPARISON:  06/14/2014.  FINDINGS: Mediastinum and hilar structures are unremarkable. Cardiomegaly. Progressive bibasilar infiltrates and pleural effusions are noted. These changes could be related to basilar pneumonia and/or congestive heart failure with pulmonary edema. Prominent skin fold on the left. No pneumothorax. No acute osseous abnormality.  IMPRESSION: 1. Progressive bibasilar pulmonary alveolar infiltrates and pleural effusions. Pleural effusion on the right this prominent. These findings have progressed from prior exam and could be infectious or related to CHF. 2. Cardiomegaly.   Electronically Signed   By: Maisie Fus  Register   On: 06/17/2014 14:48   Dg Abd Portable 1v 06/17/2014   CLINICAL DATA:  Reassess small-bowel obstruction  EXAM: PORTABLE ABDOMEN - 1 VIEW  COMPARISON:  Portable abdominal films of June 16, 2014  FINDINGS: Again noted are loops of moderately distended gas-filled small bowel in the mid and lower  abdomen. There is contrast in the descending colon and rectosigmoid from the CT scan of June 12, 2014. There is sigmoid diverticulosis. There is no evidence of perforation on this single supine view.  IMPRESSION: Persistent small bowel obstruction without significant change.   Electronically Signed   By: David  [place]   On: 06/17/2014 07:42   Dg Abd Portable 1v 06/16/2014   CLINICAL DATA:  Abdominal pain after eating.  EXAM: PORTABLE ABDOMEN - 1 VIEW  COMPARISON:  Earlier today.  FINDINGS: There has been no significant change in multiple dilated small bowel loops. No nasogastric tube is seen. Lumbar and lower thoracic spine degenerative changes. The previously seen contrast in the  colon has moved into the rectum and distal sigmoid colon with multiple diverticula noted.  IMPRESSION: 1. Stable small bowel obstruction. 2. Colonic diverticulosis.   Electronically Signed   By: Gordan Payment M.D.   On: 06/16/2014 20:00   Dg Abd Portable 1v 06/16/2014   CLINICAL DATA:  Reassess small-bowel obstruction  EXAM: PORTABLE ABDOMEN - 1 VIEW  COMPARISON:  Abdominal series of June 15, 2014  FINDINGS: There is a persistent distal small bowel obstruction. The contrast in the right left colon from the August 19th abdominal pelvic CT scan is is little changed. Retrocardiac density on the left is consistent with atelectasis.  IMPRESSION: There is persistent distal small bowel obstruction. There is no evidence of perforation.   Electronically Signed   By: David  [place]   On: 06/16/2014 14:40    Current Medications:  . antiseptic oral rinse  7 mL Mouth Rinse BID  . aspirin  81 mg Oral Daily  . diltiazem  30 mg Oral 3 times per day  . enoxaparin (LOVENOX) injection  30 mg Subcutaneous Q24H  . feeding supplement (ENSURE COMPLETE)  237 mL Oral TID WC  . feeding supplement (RESOURCE BREEZE)  1 Container Oral TID WC  . furosemide  40 mg Oral BID  . metoprolol tartrate  12.5 mg Oral BID  . [START ON 06/19/2014] pantoprazole  40 mg Oral Q1200  . sodium chloride  3 mL Intravenous Q12H  . sodium chloride  3 mL Intravenous Q12H  . Travoprost (BAK Free)  1 drop Both Eyes QHS      ASSESSMENT AND PLAN: Principal Problem:   Small bowel obstruction - per IM, CCS  Active Problems:   Atrial fibrillation - paroxysmal, currently in SR.    Essential hypertension - per IM, SBP 110s-140s on current rx   Congestive heart disease - will recheck BNP in am, weight 125 on admission, initial CXR w/ possible fluid. Weight in March 47 kg. Now 57 kg. Feel dry weight somewhere in between. PO intake is very poor. No output charted for yesterday or today. Continue current conservative therapy.   SBO (small bowel  obstruction) - per IM.    Hypokalemia - may contribute to some of the PVCs she is having now. Will leave supplement to primary care.  Signed, Theodore Demark , PA-C 12:56 PM 06/18/2014  Patient seen, examined. Available data reviewed. Agree with findings, assessment, and plan as outlined by Theodore Demark, PA-C. The patient was independently examined. I/O's don't seem to be accurately recorded. Exam shows frail elderly woman in NAD. Lungs diminished in the bases. JVP mildly elevated. No peripheral edema. Heart RRR without murmur.   PO intake remains very poor. Will not push diuretics too much in the setting of minimal PO intake. Continue oral furosemide. Breathing is much more comfortable today.  Will continue to follow with you.  Tonny Bollman, M.D. 06/18/2014 1:56 PM

## 2014-06-18 NOTE — Progress Notes (Signed)
PT Cancellation Note  Patient Details Name: Paula Nash MRN: [id number] DOB: [birth year]   Cancelled Treatment:    Reason Eval/Treat Not Completed: Fatigue/lethargy limiting ability to participate. Pt had just received nausea medication and was not able to maintain arousal enough to participate in therapy session. Pt attempting to stay awake and answer questions, however speech was unintelligible due to decreased level of arousal. Will continue to follow and attempt again tomorrow.     Ruthann Cancer 06/18/2014, 4:52 PM  Ruthann Cancer, PT, DPT Acute Rehabilitation Services Pager: [phone]

## 2014-06-18 NOTE — Progress Notes (Signed)
Patient does not appear clinically to be obstructed.  Still with some mild dilation Advance diet to fulls Follow-up films tomorrow.  Wilmon Arms. Corliss Skains, MD, [facility] Surgery  General/ Trauma Surgery  06/18/2014 10:46 AM

## 2014-06-19 ENCOUNTER — Inpatient Hospital Stay (HOSPITAL_COMMUNITY): Payer: Medicare Other

## 2014-06-19 LAB — BASIC METABOLIC PANEL
Anion gap: 19 — ABNORMAL HIGH (ref 5–15)
BUN: 28 mg/dL — AB (ref 6–23)
CO2: 16 mEq/L — ABNORMAL LOW (ref 19–32)
CREATININE: 0.84 mg/dL (ref 0.50–1.10)
Calcium: 8.5 mg/dL (ref 8.4–10.5)
Chloride: 110 mEq/L (ref 96–112)
GFR calc non Af Amer: 58 mL/min — ABNORMAL LOW (ref >90)
GFR, EST AFRICAN AMERICAN: 67 mL/min — AB (ref >90)
Glucose, Bld: 200 mg/dL — ABNORMAL HIGH (ref 70–99)
POTASSIUM: 4.4 meq/L (ref 3.7–5.3)
Sodium: 145 mEq/L (ref 137–147)

## 2014-06-19 LAB — GLUCOSE, CAPILLARY
Glucose-Capillary: 183 mg/dL — ABNORMAL HIGH (ref 70–99)
Glucose-Capillary: 187 mg/dL — ABNORMAL HIGH (ref 70–99)

## 2014-06-19 LAB — BLOOD GAS, ARTERIAL
Acid-base deficit: 8 mmol/L — ABNORMAL HIGH (ref 0.0–2.0)
Bicarbonate: 17.9 mEq/L — ABNORMAL LOW (ref 20.0–24.0)
DRAWN BY: 252031
FIO2: 1 %
O2 Saturation: 88 %
PCO2 ART: 42.2 mmHg (ref 35.0–45.0)
Patient temperature: 98.6
TCO2: 19.2 mmol/L (ref 0–100)
pH, Arterial: 7.252 — ABNORMAL LOW (ref 7.350–7.450)
pO2, Arterial: 67.9 mmHg — ABNORMAL LOW (ref 80.0–100.0)

## 2014-06-19 LAB — LACTIC ACID, PLASMA: Lactic Acid, Venous: 2.5 mmol/L — ABNORMAL HIGH (ref 0.5–2.2)

## 2014-06-19 MED ORDER — MORPHINE SULFATE 2 MG/ML IJ SOLN
2.0000 mg | INTRAMUSCULAR | Status: DC | PRN
  Administered 2014-06-20 – 2014-06-21 (×4): 2 mg via INTRAVENOUS
  Filled 2014-06-19 (×5): qty 1

## 2014-06-19 MED ORDER — DILTIAZEM HCL 25 MG/5ML IV SOLN
5.0000 mg | Freq: Once | INTRAVENOUS | Status: AC
  Administered 2014-06-19: 5 mg via INTRAVENOUS
  Filled 2014-06-19: qty 5

## 2014-06-19 MED ORDER — LORAZEPAM 2 MG/ML IJ SOLN
0.5000 mg | INTRAMUSCULAR | Status: DC | PRN
  Administered 2014-06-21: 0.5 mg via INTRAVENOUS
  Filled 2014-06-19: qty 1

## 2014-06-19 NOTE — Progress Notes (Signed)
Agree  Paula Gelinas, MD, MPH, FACS Trauma: [phone] General Surgery: [phone]  06/19/2014 11:04 AM

## 2014-06-19 NOTE — Significant Event (Signed)
Rapid Response Event Note Called by primary RN to see pt for shortness or breath & tachycardia Overview: Time Called: 0435 Arrival Time: 0438 Event Type: Respiratory;Cardiac  Initial Focused Assessment: On assessment pt is very SOB, RR 35, sats 92% on NRB, HR 135-160 A. Fib on EKG. BBS course & diminished.  Pt is calling for Jesus to help her.  She states she wants to just fade away & does not wish to be intubated or coded.  Interventions: ABG EKG BMT, lactic acid Cardizem  IV Morphine  IV  Event Summary: Name of Physician Notified: Donnamarie Poag, NP at [phone]    Family arrived to bedside.  Supportive of comfort care measures.     Hanley Rispoli Hedgecock

## 2014-06-19 NOTE — Progress Notes (Addendum)
Patient ID: Paula Nash, female   DOB: Jan 17, 1919, 80 y.o.   MRN: [id number] [facility] PROGRESS NOTE  Paula Nash:[id number] DOB: Jan 28, 1919 DOA: 06/03/2014 PCP: Burtis Junes, MD  Brief narrative: 80 y.o. female with history of atrial fibrillation not on anticoagulants, hypertension, CHF, arthritis who presented to [facility] 06/17/2014 with abdominal pain, nausea, vomiting, weakness. She was found to have small bowel obstruction. Hospital course is complicated with development of Afib with RVR, hypoxia, CHF  Assessment/Plan:    Acute Respiratory failure Afib with RVR Small bowel obstruction Hypokalemia  Generalized weakness Hypertension Acute systolic CHF -decompensated overnight, now DNR and comfort care after discussion with family -continue morphine and ativan PRN -will plan Residential vs Home with Hospice if she does not decline quickly  Code Status: DNR Family Communication:  plan of care discussed with the patient's family at bedside Disposition Plan: home with hospice if survives   IV Access:   Peripheral IV Procedures and diagnostic studies:    Dg Chest Port 1 View 07/06/2014    1. Progressive bibasilar pulmonary alveolar infiltrates and pleural effusions. Pleural effusion on the right this prominent. These findings have progressed from prior exam and could be infectious or related to CHF. 2. Cardiomegaly.     Dg Abd Portable 1v Jul 06, 2014  Persistent small bowel obstruction without significant change.     Dg Abd Portable 1v  06/16/2014  Stable small bowel obstruction. 2. Colonic diverticulosis.     Dg Abd Portable 1v 06/16/2014 There is persistent distal small bowel obstruction. There is no evidence of perforation.     Medical Consultants:   Surgery Cardiology  Other Consultants:   None  Anti-Infectives:   None    Zannie Cove, MD  [facility] Pager [phone]  If 7PM-7AM, please contact night-coverage [email or web address] Password  [facility] 06/19/2014, 2:37 PM   LOS: 7 days    HPI/Subjective: Overnight with Hypoxia, tachycardia, resp distress, pulled out NG, after discussion with family now comfort care  Objective: Filed Vitals:   06/18/14 1359 06/18/14 1459 06/18/14 2032 06/19/14 0553  BP: 119/72 109/52 149/80 125/68  Pulse: 87 89 79 155  Temp: 99.2 F (37.3 C)  97 F (36.1 C)   TempSrc: Axillary  Oral   Resp: 27  22   Height:      Weight:      SpO2: 98%  98% 92%    Intake/Output Summary (Last 24 hours) at 06/19/14 1437 Last data filed at 06/19/14 1300  Gross per 24 hour  Intake    173 ml  Output    800 ml  Net   -627 ml    Exam:   General:  unresponsive  Cardiovascular: Regular rate and rhythm, S1/S2 irregular, tachycardic  Respiratory: diminished at bases, no wheezing   Abdomen: distended, bowel sounds present  Extremities: Pulses DP and PT palpable bilaterally  Neuro: Grossly nonfocal  Data Reviewed: Basic Metabolic Panel:  Recent Labs Lab 06/14/14 1030 06/15/14 0148 06/16/14 1419 2014-07-06 1014 06/18/14 0449 06/19/14 0459  NA 144 143 142 143 143 145  K 3.3* 3.4* 3.6* 3.8 3.5* 4.4  CL 107 107 108 109 108 110  CO2 20 17* 16*  GLUCOSE 93 159* 180* 160* 154* 200*  BUN 28*  CREATININE 0.68 0.65 0.59 0.57 0.61 0.84  CALCIUM 8.3* 8.3* 8.2* 8.2* 8.5 8.5  MG  --  2.0  --   --   --   --  Liver Function Tests:  Recent Labs Lab 05/26/2014 1547 06/13/14 0503 06/15/14 0148  AST ALT ALKPHOS 46 38* 36*  BILITOT 0.5 0.4 0.4  PROT 6.7 5.6* 5.3*  ALBUMIN 3.4* 2.8* 2.4*    Recent Labs Lab 05/27/2014 1547  LIPASE 13   No results found for this basename: AMMONIA,  in the last 168 hours CBC:  Recent Labs Lab 06/23/2014 1547 06/13/14 0503 06/15/14 0148 06/16/14 1550  WBC 8.8 7.9 3.1* 3.1*  NEUTROABS 7.7 5.3 1.1*  --   HGB 10.0* 9.6* 10.0* 9.7*  HCT 30.3* 28.6* 29.9* 29.5*  MCV 82.1 82.2 82.4 82.2  PLT 341 327 323 362   Cardiac  Enzymes: No results found for this basename: CKTOTAL, CKMB, CKMBINDEX, TROPONINI,  in the last 168 hours BNP: No components found with this basename: POCBNP,  CBG:  Recent Labs Lab 06/18/14 0715 06/18/14 1114 06/18/14 1606 06/19/14 0028 06/19/14 0422  GLUCAP 159* 157* 190* 183* 187*    No results found for this or any previous visit (from the past 240 hour(s)).   Scheduled Meds: . aspirin  81 mg Oral Daily  . diltiazem  30 mg Oral 3 times per day  . enoxaparin (LOVENOX) injection  30 mg Subcutaneous Q24H  . feeding supplement (ENSURE COMPLETE)  237 mL Oral TID WC  . feeding supplement (RESOURCE BREEZE)  1 Container Oral TID WC  . furosemide  40 mg Oral BID  . metoprolol tartrate  12.5 mg Oral BID  . [START ON 06/19/2014] pantoprazole  40 mg Oral Q1200  . potassium chloride  10 mEq Intravenous Q1 Hr x 3

## 2014-06-19 NOTE — Progress Notes (Signed)
Shift event: Have been paged several times about Paula Nash. To begin with, pt pulled her NGT out and RN had to replace it. NGT was not in stomach at that time. Then, 2nd pass, with some manipulation, NGT was in and large amount of gastric secretions were returned-brownish green. Later, in shift, was paged secondary to pt's HR climbing into the 160s and tele reading SVT. Rapid response was called. Pt was also desatting into the 80s and a NRB was placed. 12 lead EKG showed Afib with RVR.  Cardizem given IV push. NP to bedside. Pt was alert and oriented, talking appropriately although SOB. Paula Nash expressed to me that she did not want any CPR or intubation and should be changed to a DNR. ABG obtained with slightly high PCO2 and slightly low PO2-however, by that time, it was decided not to take any further steps. Morphine given for comfort and pt was comforted at bedside. Family called and arrived later. Pt was praying and singing gospel songs stating she was ready to go home and see Jesus. NG secretions were noted to also contain blood at that time. BP stable and HR remains high so another  Cardizem given.  Family arrived and this NP spoke to daughter. Daughter says she is aware that her mother is very sick and that her mother is ready to die and doesn't want anything else done.  Various meds d/c'd. Comfort meds ordered.  Paula Norman, NP [facility]

## 2014-06-19 NOTE — Progress Notes (Signed)
Patient ID: Paula Nash, female   DOB: 1919-01-09, 80 y.o.   MRN: [id number]    Subjective: Events of last night noted.  afib with RVR, desat, and N/V with replacement of NGT with 1000cc of output, then pulled out by patient.  Patient wished to be DNR and no further escalation of care.  I have reconfirmed that with the family this morning at the bedside.  Objective: Vital signs in last 24 hours: Temp:  [97 F (36.1 C)-99.2 F (37.3 C)] 97 F (36.1 C) (08/25 2032) Pulse Rate:  [79-155] 155 (08/26 0553) Resp:  [22-27] 22 (08/25 2032) BP: (109-149)/(52-85) 125/68 mmHg (08/26 0553) SpO2:  [92 %-99 %] 92 % (08/26 0553) FiO2 (%):  [100 %] 100 % (08/26 0553) Weight:  [127 lb 3.2 oz (57.698 kg)] 127 lb 3.2 oz (57.698 kg) (08/25 0757) Last BM Date: 06/17/14  Intake/Output from previous day: 08/25 0701 - 08/26 0700 In: 339 [P.O.:330; I.V.:9] Out: 1225 [Urine:225; Emesis/NG output:1000] Intake/Output this shift:    PE: Abd: soft, bowel is palpable on the left side of abdomen, hypoactive bowel sounds, NT  Lab Results:   Recent Labs  06/16/14 1550  WBC 3.1*  HGB 9.7*  HCT 29.5*  PLT 362   BMET  Recent Labs  06/18/14 0449 06/19/14 0459  NA 143 145  K 3.5* 4.4  CL 108 110  CO2 22 16*  GLUCOSE 154* 200*  BUN 17 28*  CREATININE 0.61 0.84  CALCIUM 8.5 8.5   PT/INR No results found for this basename: LABPROT, INR,  in the last 72 hours CMP     Component Value Date/Time   NA 145 06/19/2014 0459   K 4.4 06/19/2014 0459   CL 110 06/19/2014 0459   CO2 16* 06/19/2014 0459   GLUCOSE 200* 06/19/2014 0459   BUN 28* 06/19/2014 0459   CREATININE 0.84 06/19/2014 0459   CALCIUM 8.5 06/19/2014 0459   PROT 5.3* 06/15/2014 0148   ALBUMIN 2.4* 06/15/2014 0148   AST 15 06/15/2014 0148   ALT 6 06/15/2014 0148   ALKPHOS 36* 06/15/2014 0148   BILITOT 0.4 06/15/2014 0148   GFRNONAA 58* 06/19/2014 0459   GFRAA 67* 06/19/2014 0459   Lipase     Component Value Date/Time   LIPASE 13 06/06/2014  1547       Studies/Results: Dg Chest Port 1 View  06/17/2014   CLINICAL DATA:  Shortness of breath.  EXAM: PORTABLE CHEST - 1 VIEW  COMPARISON:  06/14/2014.  FINDINGS: Mediastinum and hilar structures are unremarkable. Cardiomegaly. Progressive bibasilar infiltrates and pleural effusions are noted. These changes could be related to basilar pneumonia and/or congestive heart failure with pulmonary edema. Prominent skin fold on the left. No pneumothorax. No acute osseous abnormality.  IMPRESSION: 1. Progressive bibasilar pulmonary alveolar infiltrates and pleural effusions. Pleural effusion on the right this prominent. These findings have progressed from prior exam and could be infectious or related to CHF. 2. Cardiomegaly.   Electronically Signed   By: Maisie Fus  Register   On: 06/17/2014 14:48   Dg Abd 2 Views  06/18/2014   CLINICAL DATA:  Abdominal pain.  Small bowel obstruction.  EXAM: ABDOMEN - 2 VIEW  COMPARISON:  Radiographs dated 06/17/2014, 06/16/2014, and CT scan dated 06/17/2014  FINDINGS: The contrast in the nondistended colon has been almost completely evacuated. However, there are multiple persistent dilated small bowel loops. There is air in the stomach. The tip of the NG tube is in the fundus of the stomach with  the side hole in the distal esophagus.  No free air.  IMPRESSION: Persistent pattern of partial small bowel obstruction.   Electronically Signed   By: Geanie Cooley M.D.   On: 06/18/2014 21:53   Dg Abd Portable 1v  06/19/2014   CLINICAL DATA:  NG tube placement  EXAM: PORTABLE ABDOMEN - 1 VIEW  COMPARISON:  06/19/2014  FINDINGS: NG tube has advanced somewhat into the left upper quadrant but remains coiled upon itself with tip likely in the distal esophagus. Mild gaseous distention of stomach. Gaseous distention of small bowel.  IMPRESSION: Enteric tube extends into the left upper quadrant but coils back upon itself with tip likely in the distal esophagus.   Electronically Signed   By:  Burman Nieves M.D.   On: 06/19/2014 02:20   Dg Abd Portable 1v  06/19/2014   CLINICAL DATA:  NG tube placement  EXAM: PORTABLE ABDOMEN - 1 VIEW  COMPARISON:  06/18/2014  FINDINGS: Enteric tube tip is coiled back on itself and located in the medial epigastric region. The tube is likely either entirely within the distal esophagus or coiled back into the distal esophagus. Advancement is recommended. Persistent gaseous distention of small bowel loops again demonstrated.  IMPRESSION: Enteric tube tip is coiled back upon itself, likely in the distal esophagus.   Electronically Signed   By: Burman Nieves M.D.   On: 06/19/2014 01:13    Anti-infectives: Anti-infectives   None       Assessment/Plan  1. SBO, worsening 2. afib with RVR  Plan: 1. The patient's family and the patient are very reasonable.  The patient has expressed she does not want any further aggressive treatment.  She is ready to pass.  She is being kept comfortable.  She just wants ice chips which are ok.  She and the family do not want any surgery and are ok with this current plan.  We will sign off.   LOS: 7 days    Stephinie Battisti E 06/19/2014, 7:39 AM Pager: [phone]

## 2014-06-20 DIAGNOSIS — I5023 Acute on chronic systolic (congestive) heart failure: Secondary | ICD-10-CM

## 2014-06-20 MED ORDER — LORAZEPAM 0.5 MG PO TABS: 0.5000 mg | ORAL_TABLET | ORAL | Status: AC | PRN

## 2014-06-20 MED ORDER — MORPHINE SULFATE (CONCENTRATE) 20 MG/ML PO SOLN: 5.0000 mg | ORAL | Status: AC | PRN

## 2014-06-20 NOTE — Discharge Summary (Addendum)
Physician Discharge Summary  Paula Nash:[id number] DOB: 1919-03-30 DOA: Jun 26, 2014  PCP: Paula Junes, MD  Admit date: 26-Jun-2014 Discharge date: 06/20/2014  Time spent: 35 minutes  [facility]  Discharge Diagnoses:    Acute respiratory failure   Acute systolic CHF   Afib with RVR   Small bowel obstruction   Failure to thrive   Atrial fibrillation   Essential hypertension   Congestive heart disease   Discharge Condition: poor  Diet recommendation: ice chips, sips as tolerated  Filed Weights   06/18/14 0430 06/18/14 0757 06/20/14 0640  Weight: 57.1 kg (125 lb 14.1 oz) 57.698 kg (127 lb 3.2 oz) 57.652 kg (127 lb 1.6 oz)    History of present illness:  Paula Nash is a 80 y.o. female with history of atrial fibrillation not on anticoagulants, hypertension, CHF, arthritis was brought to the ER after patient was having nausea vomiting and abdominal pain and weakness. As per the patient's daughter patient has not been doing well since 2 days. Patient over the last one month has been increasingly weak with increasing pain in her knees from worsening arthritis. Patient also has been having increasing lower extremity edema and her primary care physician had just increased her Lasix dose to 80 mg 2 weeks ago. Patient has been feeling difficult to walk due to pain in both her knees which has been ongoing for a long time but has worsened recently. Earlier yesterday morning patient started having nausea vomiting and lower abdominal pain. In the ER CT of the abdomen and pelvis shows features concerning for small bowel obstruction   Hospital Course:  Acute Respiratory failure  Afib with RVR  Small bowel obstruction  Hypokalemia  Generalized weakness  Hypertension  Acute systolic CHF  -She was originally admitted with SBO. -Was followed by Surgery and Cardiology and being managed conservatively, hospital course complicated by Hypoxia, Acute  systolic CHF, Acute respiratory failure and Afib with RVR, after discussion with family decision was made for DNR and Comfort measures and now for [facility].   Consultations:  CCS  Cards  Discharge Exam: Filed Vitals:   06/20/14 0640  BP: 107/55  Pulse: 114  Temp: 98.1 F (36.7 C)  Resp: 20    General: unresponsive Cardiovascular: S1S2/RRR Respiratory:basilar crackles  Discharge Instructions You were cared for by a hospitalist during your hospital stay. If you have any questions about your discharge medications or the care you received while you were in the hospital after you are discharged, you can call the unit and asked to speak with the hospitalist on call if the hospitalist that took care of you is not available. Once you are discharged, your primary care physician will handle any further medical issues. Please note that NO REFILLS for any discharge medications will be authorized once you are discharged, as it is imperative that you return to your primary care physician (or establish a relationship with a primary care physician if you do not have one) for your aftercare needs so that they can reassess your need for medications and monitor your lab values.     Medication List    STOP taking these medications       alum & mag hydroxide-simeth 400-400-40 MG/5ML suspension  Commonly known as:  MAALOX PLUS     aspirin 81 MG tablet     dextromethorphan 30 MG/5ML liquid  Commonly known as:  DELSYM     ferrous sulfate 325 (65 FE) MG EC tablet  furosemide 80 MG tablet  Commonly known as:  LASIX     HYDROcodone-acetaminophen 5-325 MG per tablet  Commonly known as:  NORCO/VICODIN     ibuprofen 200 MG tablet  Commonly known as:  ADVIL,MOTRIN     meloxicam 15 MG tablet  Commonly known as:  MOBIC     travoprost (benzalkonium) 0.004 % ophthalmic solution  Commonly known as:  TRAVATAN     valsartan 80 MG tablet  Commonly known as:  DIOVAN      TAKE these  medications       lansoprazole 15 MG capsule  Commonly known as:  PREVACID  Take 15 mg by mouth daily as needed (for heartburn).     LORazepam 0.5 MG tablet  Commonly known as:  ATIVAN  Take 1 tablet (0.5 mg total) by mouth every 4 (four) hours as needed for anxiety.     morphine 20 MG/ML concentrated solution  Commonly known as:  ROXANOL  Take 0.25 mLs (5 mg total) by mouth every 2 (two) hours as needed for air hunger or pain.       Allergies  Allergen Reactions  . Celebrex [Celecoxib] Other (See Comments)    Myalgia  . Amiodarone Hcl [Amiodarone] Cough      The results of significant diagnostics from this hospitalization (including imaging, microbiology, ancillary and laboratory) are listed below for reference.    Significant Diagnostic Studies: Ct Head Wo Contrast  06/13/2014   CLINICAL DATA:  Weakness.  EXAM: CT HEAD WITHOUT CONTRAST  TECHNIQUE: Contiguous axial images were obtained from the base of the skull through the vertex without intravenous contrast.  COMPARISON:  None.  FINDINGS: Stable age related cerebral atrophy, ventriculomegaly and periventricular white matter disease. No extra-axial fluid collections are identified. No CT findings for acute hemispheric infarction or intracranial hemorrhage. No mass lesions. The brainstem and cerebellum are normal.  The bony structures are intact. No skull fracture. The paranasal sinuses and mastoid air cells are clear. The globes are intact.  IMPRESSION: Age related cerebral atrophy, ventriculomegaly and periventricular white matter disease.  No acute intracranial findings or mass lesion.   Electronically Signed   By: Loralie Champagne M.D.   On: 06/13/2014 00:05   Ct Abdomen Pelvis W Contrast  05/28/2014   CLINICAL DATA:  Mid abdominal pain since yesterday.  EXAM: CT ABDOMEN AND PELVIS WITH CONTRAST  TECHNIQUE: Multidetector CT imaging of the abdomen and pelvis was performed using the standard protocol following bolus administration  of intravenous contrast.  CONTRAST:  80mL OMNIPAQUE IOHEXOL 300 MG/ML  SOLN  COMPARISON:  None.  FINDINGS: Lung Bases: Patchy airspace disease in the right base posteriorly may be atelectatic, but aspiration and/or infection could have this appearance. There are tiny bilateral pleural effusions, right greater than left.  Liver:  No focal mass lesion within the liver.  Spleen: No splenomegaly. No focal mass lesion.  Stomach: Small hiatal hernia.  No gastric distention.  Pancreas: Slight prominence of the main pancreatic duct measuring 4 mm diameter in the head of the pancreas. No pancreatic head mass is evident on this study.  Gallbladder/Biliary: Gallbladder is markedly distended with some probable layering tiny calcified stones along the dependent wall. Mild intrahepatic biliary duct dilatation is evident. Extrahepatic common duct measures approximately 6 mm in diameter.  Kidneys/Adrenals: No adrenal nodule or mass. Areas of focal cortical scarring are seen in both kidneys.  Bowel Loops: Duodenum is redundant. Small bowel is diffusely distended measuring up to 3.0 cm in diameter. No  evidence for pneumatosis. The colon is decompressed throughout. Diverticular changes are noted in the left colon without diverticulitis.  Nodes: No evidence for lymphadenopathy in the abdomen or pelvis.  Vasculature: Atherosclerotic calcification is noted in the wall of the abdominal aorta without aneurysm. The portal vein and superior mesenteric vein are patent. Splenic vein is patent. The celiac axis, superior mesenteric artery and inferior mesenteric artery opacified.  Pelvic Genitourinary: Bladder is minimally distended. Uterus is surgically absent. No evidence for adnexal mass.  Bones/Musculoskeletal: Bone windows reveal no worrisome lytic or sclerotic osseous lesions.  Body Wall: There is diffuse body wall edema in the abdomen and pelvis. No evidence for body wall hernia.  Other: Moderate volume intraperitoneal free fluid is  associated with mesenteric edema.  IMPRESSION: 1. Diffuse mild small bowel dilatation. Although a distinct transition zone cannot be identified, the terminal ileum appears decompressed as does the colon. Given the colonic decompression, a distal small bowel obstruction is considered more likely based on imaging band diffuse ileus. Again, the etiology of the obstruction is not evident on this scan. Specifically, no evidence for body wall hernia and there is no distinct transition zone evident. 2. Moderate intraperitoneal free fluid with diffuse body wall edema. 3. Distended gallbladder with layering tiny gallstones. No substantial biliary dilatation.   Electronically Signed   By: Kennith Center M.D.   On: 06/05/2014 21:45   Dg Chest Port 1 View  06/17/2014   CLINICAL DATA:  Shortness of breath.  EXAM: PORTABLE CHEST - 1 VIEW  COMPARISON:  06/14/2014.  FINDINGS: Mediastinum and hilar structures are unremarkable. Cardiomegaly. Progressive bibasilar infiltrates and pleural effusions are noted. These changes could be related to basilar pneumonia and/or congestive heart failure with pulmonary edema. Prominent skin fold on the left. No pneumothorax. No acute osseous abnormality.  IMPRESSION: 1. Progressive bibasilar pulmonary alveolar infiltrates and pleural effusions. Pleural effusion on the right this prominent. These findings have progressed from prior exam and could be infectious or related to CHF. 2. Cardiomegaly.   Electronically Signed   By: Maisie Fus  Register   On: 06/17/2014 14:48   Dg Abd 2 Views  06/18/2014   CLINICAL DATA:  Abdominal pain.  Small bowel obstruction.  EXAM: ABDOMEN - 2 VIEW  COMPARISON:  Radiographs dated 06/17/2014, 06/16/2014, and CT scan dated 06/03/2014  FINDINGS: The contrast in the nondistended colon has been almost completely evacuated. However, there are multiple persistent dilated small bowel loops. There is air in the stomach. The tip of the NG tube is in the fundus of the stomach  with the side hole in the distal esophagus.  No free air.  IMPRESSION: Persistent pattern of partial small bowel obstruction.   Electronically Signed   By: Geanie Cooley M.D.   On: 06/18/2014 21:53   Dg Abd 2 Views  06/15/2014   CLINICAL DATA:  Lower abdominal pain  EXAM: ABDOMEN - 2 VIEW  COMPARISON:  Prior acute abdominal series 06/14/14  FINDINGS: On the right lateral decubitus view there is an ovoid collection of air along the right abdominal wall. There is no evidence of free air on the upright view. In the region of this same air there was a loop of bowel on the prior study. On the frontal view, there is a similar degree of gaseous distention of multiple loops of small bowel throughout the abdomen. Continued distal progression of contrast material through the colon and into the rectum. The bones appear osteopenic. Multilevel degenerative disc disease.  IMPRESSION: 1. Ovoid collection  of air without bowel markings on the lateral decubitus view could not be replicated on the upright view. There was a loop of bowel in the same location on the prior acute abdominal series. I favor this to represent increased dilatation of this loop with a air layering in the bowel lumen rather than true free air. 2. Similar degree of a multifocal loops of dilated small bowel throughout the abdomen. Small bowel there is continued distal progression of oral contrast material through the colon.   Electronically Signed   By: Malachy Moan M.D.   On: 06/15/2014 13:44   Dg Abd Acute W/chest  06/14/2014   CLINICAL DATA:  Abdominal pain  EXAM: ACUTE ABDOMEN SERIES (ABDOMEN 2 VIEW & CHEST 1 VIEW)  COMPARISON:  Yesterday  FINDINGS: Bilateral central and basilar pulmonary opacity would low volumes. Normal heart size.  No free intraperitoneal gas on the left decubitus image. Dilated small bowel loops with air-fluid levels persist. Contrast is present in the colon.  IMPRESSION: Stable partial small bowel obstruction pattern.  Bibasilar  pulmonary opacity likely a combination of pleural fluid and airspace disease.   Electronically Signed   By: Maryclare Bean M.D.   On: 06/14/2014 09:53   Dg Abd Acute W/chest  06/13/2014   CLINICAL DATA:  Followup of bowel obstruction.  EXAM: ACUTE ABDOMEN SERIES (ABDOMEN 2 VIEW & CHEST 1 VIEW)  COMPARISON:  CT and acute abdomen series of of 1 day prior  FINDINGS: Frontal view of the chest demonstrates mild patient rotation to the right. Midline trachea. Mild cardiomegaly. Moderate right hemidiaphragm elevation. No pleural effusion or pneumothorax. Low lung volumes. Possible mild pulmonary venous congestion. Minimal subsegmental atelectasis at the bases.  Abdominal films demonstrate multiple small bowel air-fluid levels on right-sided decubitus positioning. No free intraperitoneal air.  Small bowel distension persists on supine imaging. Example 3.8 cm. Similar.  Contrast within the urinary bladder. Relative paucity of colonic gas. Osteopenia.  IMPRESSION: Similar small bowel obstruction, without free intraperitoneal air or other acute complication.  Cardiomegaly with low lung volumes. Cannot exclude mild pulmonary venous congestion.   Electronically Signed   By: Jeronimo Greaves M.D.   On: 06/13/2014 10:53   Dg Abd Acute W/chest  06/03/2014   CLINICAL DATA:  Abdominal pain  EXAM: ACUTE ABDOMEN SERIES (ABDOMEN 2 VIEW & CHEST 1 VIEW)  COMPARISON:  12/30/2013  FINDINGS: Mild cardiac enlargement with normal pulmonary vascularity. Lungs are clear  Dilated small bowel loops of air-fluid levels suggesting proximal small bowel obstruction. No free air. Colon is decompressed.  IMPRESSION: No active cardiopulmonary disease.  Proximal small bowel obstruction.   Electronically Signed   By: Marlan Palau M.D.   On: 05/30/2014 17:07   Dg Abd Portable 1v  06/19/2014   CLINICAL DATA:  NG tube placement  EXAM: PORTABLE ABDOMEN - 1 VIEW  COMPARISON:  06/19/2014  FINDINGS: NG tube has advanced somewhat into the left upper quadrant but  remains coiled upon itself with tip likely in the distal esophagus. Mild gaseous distention of stomach. Gaseous distention of small bowel.  IMPRESSION: Enteric tube extends into the left upper quadrant but coils back upon itself with tip likely in the distal esophagus.   Electronically Signed   By: Burman Nieves M.D.   On: 06/19/2014 02:20   Dg Abd Portable 1v  06/19/2014   CLINICAL DATA:  NG tube placement  EXAM: PORTABLE ABDOMEN - 1 VIEW  COMPARISON:  06/18/2014  FINDINGS: Enteric tube tip is coiled back on itself  and located in the medial epigastric region. The tube is likely either entirely within the distal esophagus or coiled back into the distal esophagus. Advancement is recommended. Persistent gaseous distention of small bowel loops again demonstrated.  IMPRESSION: Enteric tube tip is coiled back upon itself, likely in the distal esophagus.   Electronically Signed   By: Burman Nieves M.D.   On: 06/19/2014 01:13   Dg Abd Portable 1v  06/17/2014   CLINICAL DATA:  Reassess small-bowel obstruction  EXAM: PORTABLE ABDOMEN - 1 VIEW  COMPARISON:  Portable abdominal films of June 16, 2014  FINDINGS: Again noted are loops of moderately distended gas-filled small bowel in the mid and lower abdomen. There is contrast in the descending colon and rectosigmoid from the CT scan of June 12, 2014. There is sigmoid diverticulosis. There is no evidence of perforation on this single supine view.  IMPRESSION: Persistent small bowel obstruction without significant change.   Electronically Signed   By: David  [place]   On: 06/17/2014 07:42   Dg Abd Portable 1v  06/16/2014   CLINICAL DATA:  Abdominal pain after eating.  EXAM: PORTABLE ABDOMEN - 1 VIEW  COMPARISON:  Earlier today.  FINDINGS: There has been no significant change in multiple dilated small bowel loops. No nasogastric tube is seen. Lumbar and lower thoracic spine degenerative changes. The previously seen contrast in the colon has moved into the rectum  and distal sigmoid colon with multiple diverticula noted.  IMPRESSION: 1. Stable small bowel obstruction. 2. Colonic diverticulosis.   Electronically Signed   By: Gordan Payment M.D.   On: 06/16/2014 20:00   Dg Abd Portable 1v  06/16/2014   CLINICAL DATA:  Reassess small-bowel obstruction  EXAM: PORTABLE ABDOMEN - 1 VIEW  COMPARISON:  Abdominal series of June 15, 2014  FINDINGS: There is a persistent distal small bowel obstruction. The contrast in the right left colon from the August 19th abdominal pelvic CT scan is is little changed. Retrocardiac density on the left is consistent with atelectasis.  IMPRESSION: There is persistent distal small bowel obstruction. There is no evidence of perforation.   Electronically Signed   By: David  [place]   On: 06/16/2014 14:40    Microbiology: No results found for this or any previous visit (from the past 240 hour(s)).   Labs: Basic Metabolic Panel:  Recent Labs Lab 06/14/14 1030 06/15/14 0148 06/16/14 1419 06/17/14 1014 06/18/14 0449 06/19/14 0459  NA 144 143 142 143 143 145  K 3.3* 3.4* 3.6* 3.8 3.5* 4.4  CL 107 107 108 109 108 110  CO2 20 17* 16*  GLUCOSE 93 159* 180* 160* 154* 200*  BUN 28*  CREATININE 0.68 0.65 0.59 0.57 0.61 0.84  CALCIUM 8.3* 8.3* 8.2* 8.2* 8.5 8.5  MG  --  2.0  --   --   --   --    Liver Function Tests:  Recent Labs Lab 06/15/14 0148  AST 15  ALT 6  ALKPHOS 36*  BILITOT 0.4  PROT 5.3*  ALBUMIN 2.4*   No results found for this basename: LIPASE, AMYLASE,  in the last 168 hours No results found for this basename: AMMONIA,  in the last 168 hours CBC:  Recent Labs Lab 06/15/14 0148 06/16/14 1550  WBC 3.1* 3.1*  NEUTROABS 1.1*  --   HGB 10.0* 9.7*  HCT 29.9* 29.5*  MCV 82.4 82.2  PLT 323 362   Cardiac Enzymes: No results found for this  basename: CKTOTAL, CKMB, CKMBINDEX, TROPONINI,  in the last 168 hours BNP: BNP (last 3 results)  Recent Labs  05/26/2014 1547  PROBNP 1364.0*    CBG:  Recent Labs Lab 06/18/14 0715 06/18/14 1114 06/18/14 1606 06/19/14 0028 06/19/14 0422  GLUCAP 159* 157* 190* 183* 187*       Signed:  Jonnette Nuon  [facility] 06/20/2014, 10:44 AM

## 2014-06-21 DIAGNOSIS — J9601 Acute respiratory failure with hypoxia: Secondary | ICD-10-CM | POA: Diagnosis present

## 2014-06-25 NOTE — Progress Notes (Signed)
UR completed Otillia Cordone K. Celvin Taney, RN, BSN, MSHL, CCM  06/14/2014 1:47 PM

## 2014-06-25 NOTE — Clinical Social Work Placement (Addendum)
    Clinical Social Work Department CLINICAL SOCIAL WORK PLACEMENT NOTE 06/13/2014  Patient:  Paula Nash, Paula Nash  Account Number:  [id number] Admit date:  05/25/2014  Clinical Social Worker:  Lupita Leash Mahmood Boehringer, LCSWA  Date/time:  06/17/2014 01:47 PM  Clinical Social Work is seeking post-discharge placement for this patient at the following level of care:   SKILLED NURSING   (*CSW will update this form in Epic as items are completed)   06/17/2014  Patient/family provided with [facility] Department of Clinical Social Work's list of facilities offering this level of care within the geographic area requested by the patient (or if unable, by the patient's family).  06/17/2014  Patient/family informed of their freedom to choose among providers that offer the needed level of care, that participate in Medicare, Medicaid or managed care program needed by the patient, have an available bed and are willing to accept the patient.  06/17/2014  Patient/family informed of [facility]' ownership interest in [facility], as well as of the fact that they are under no obligation to receive care at this facility.  PASARR submitted to EDS on 06/18/2014 PASARR number received on 06/18/2014  FL2 transmitted to all facilities in geographic area requested by pt/family on   FL2 transmitted to all facilities within larger geographic area on   Patient informed that his/her managed care company has contracts with or will negotiate with  certain facilities, including the following:   [employer]- Medicare Complete     Patient/family informed of bed offers received:   Patient chooses bed at  Physician recommends and patient chooses bed at    Patient to be transferred to  on   Patient to be transferred to facility by [facility] ([facility]) Patient and family notified of transfer on  Name of family member notified:    The following physician request were entered in Epic: Physician Request  Please sign FL2.   Please prepare priority discharge summary and prescriptions.    Additional Comments: 06/24/2014  Patient was scheduled for d/c to hosp[facility] [place] this afternoon- however- prior to ambulance picking her up- she expired.  Family was present in the room. CSW notified [facility] and Lafonda Mosses, Nurse, adult for [facility].  Signing off.

## 2014-06-25 NOTE — Progress Notes (Signed)
Pt passed at 1653, MD notified, [place] [facility] notified, and [facility] picked up pt.

## 2014-06-25 NOTE — Care Management Note (Addendum)
    Page 1 of 1   06/03/2014     4:38:05 PM CARE MANAGEMENT NOTE 06/20/2014  Patient:  Paula Nash,Paula Nash   Account Number:  [id number]  Date Initiated:  06/14/2014  Documentation initiated by:  HUTCHINSON,CRYSTAL  Subjective/Objective Assessment:   abd pain, bowel obstruction     Action/Plan:   CM to follow for disposition needs   Anticipated DC Date:  06/17/2014   Anticipated DC Plan:  HOME W HOSPICE CARE      DC Planning Services  CM consult      Choice offered to / List presented to:             Status of service:  Completed, signed off Medicare Important Message given?  YES (If response is "NO", the following Medicare IM given date fields will be blank) Date Medicare IM given:  06/19/2014 Medicare IM given by:  [facility],Gery Sabedra Date Additional Medicare IM given:  06/18/2014 Additional Medicare IM given by:  Dekisha Mesmer  Discharge Disposition:  HOME/SELF CARE  Per UR Regulation:  Reviewed for med. necessity/level of care/duration of stay  If discussed at Long Length of Stay Meetings, dates discussed:    Comments:  06/19/14 1100 Oletta Cohnamellia Davide Risdon, RN, BSN, [place] [phone] The patient'Nash family and the patient are very reasonable. The patient has expressed she does not want any further aggressive treatment.  She is ready to pass.  She is being kept comfortable.  She just wants ice chips which are ok. She and the family do not want any surgery and are ok with this current plan.  Crystal Hutchinson RN, BSN, MSHL, CCM  Nurse - Case Manager,  ([facility] [phone])  [phone]  06/14/2014 Social:  from home with [facility] PT RECS:  Pending

## 2014-06-25 NOTE — Progress Notes (Signed)
Report called to [facility] at [employer].  Waiting for EMS to transport pt.

## 2014-06-25 DEATH — deceased

## 2015-04-11 IMAGING — CR DG ABDOMEN ACUTE W/ 1V CHEST
3 series · 3 of 3 positions shown · non-contrast
Comparison: Yesterday

CLINICAL DATA: Abdominal pain

EXAM:
ACUTE ABDOMEN SERIES (ABDOMEN 2 VIEW & CHEST 1 VIEW)

[w abdomen decub]
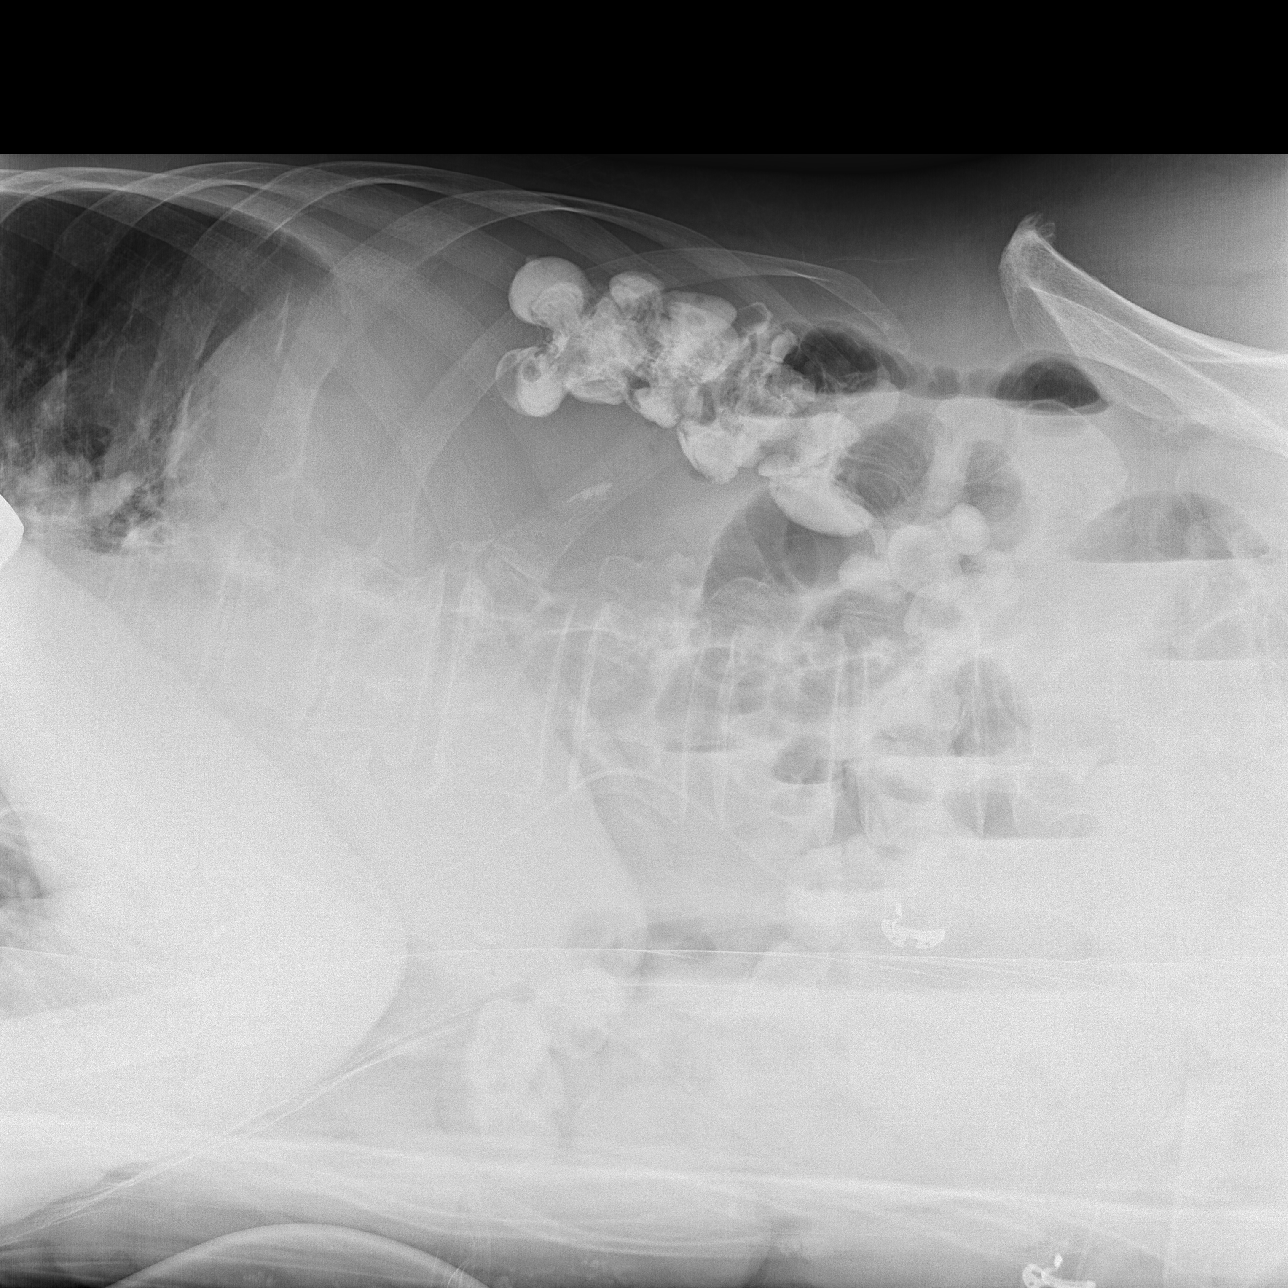

[x abdomen supine]
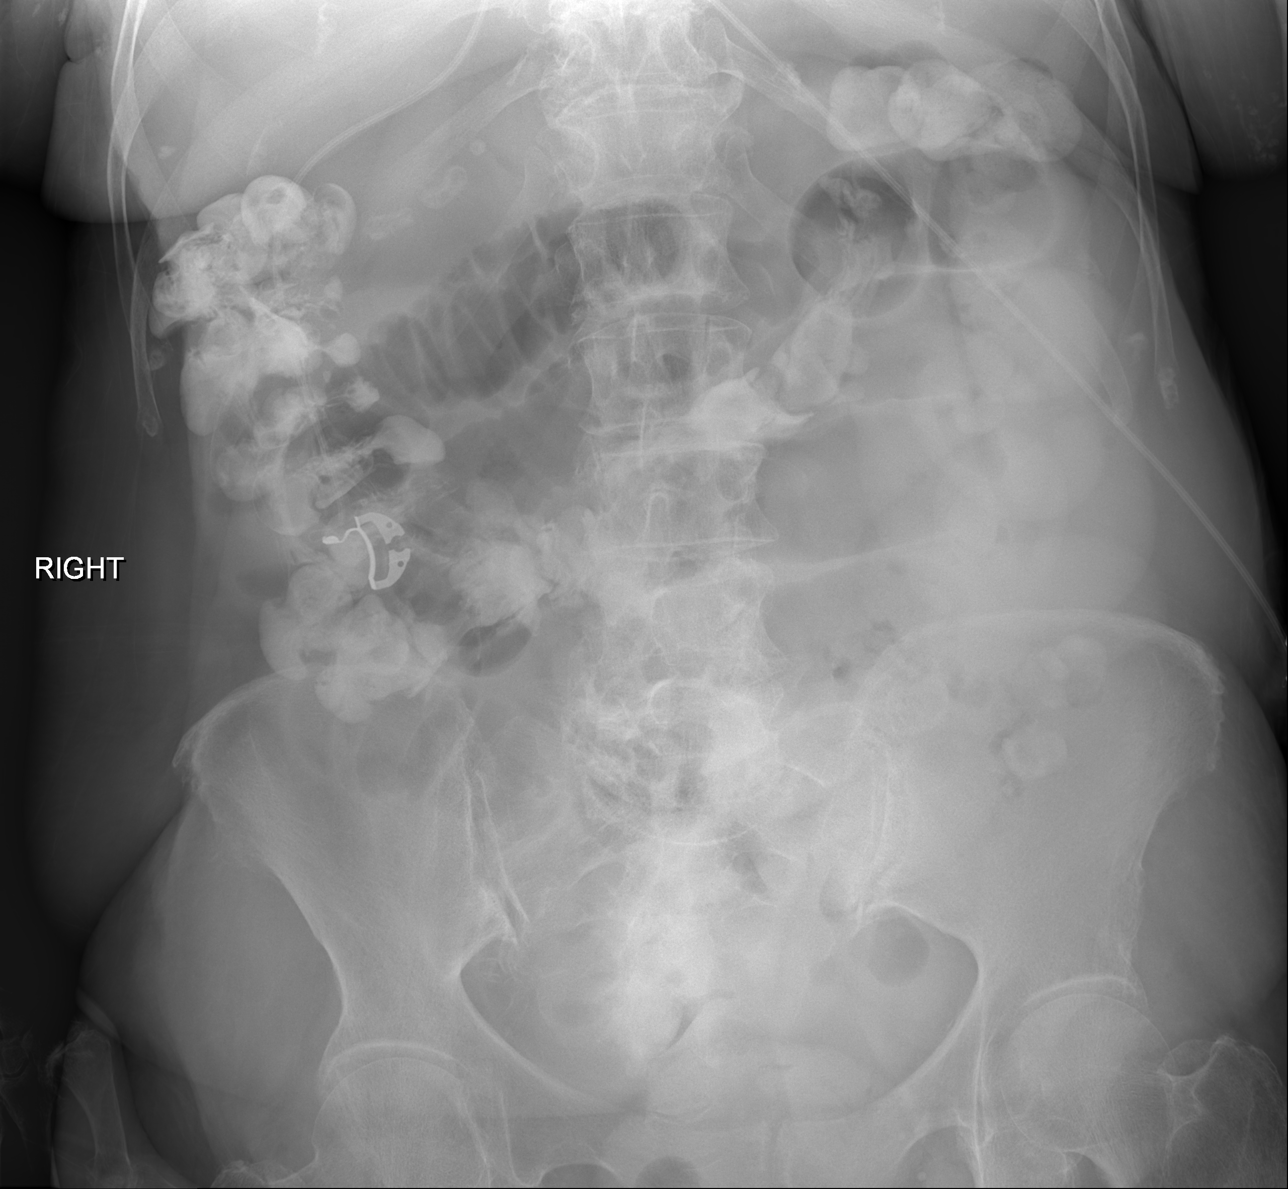

[x chest ap]
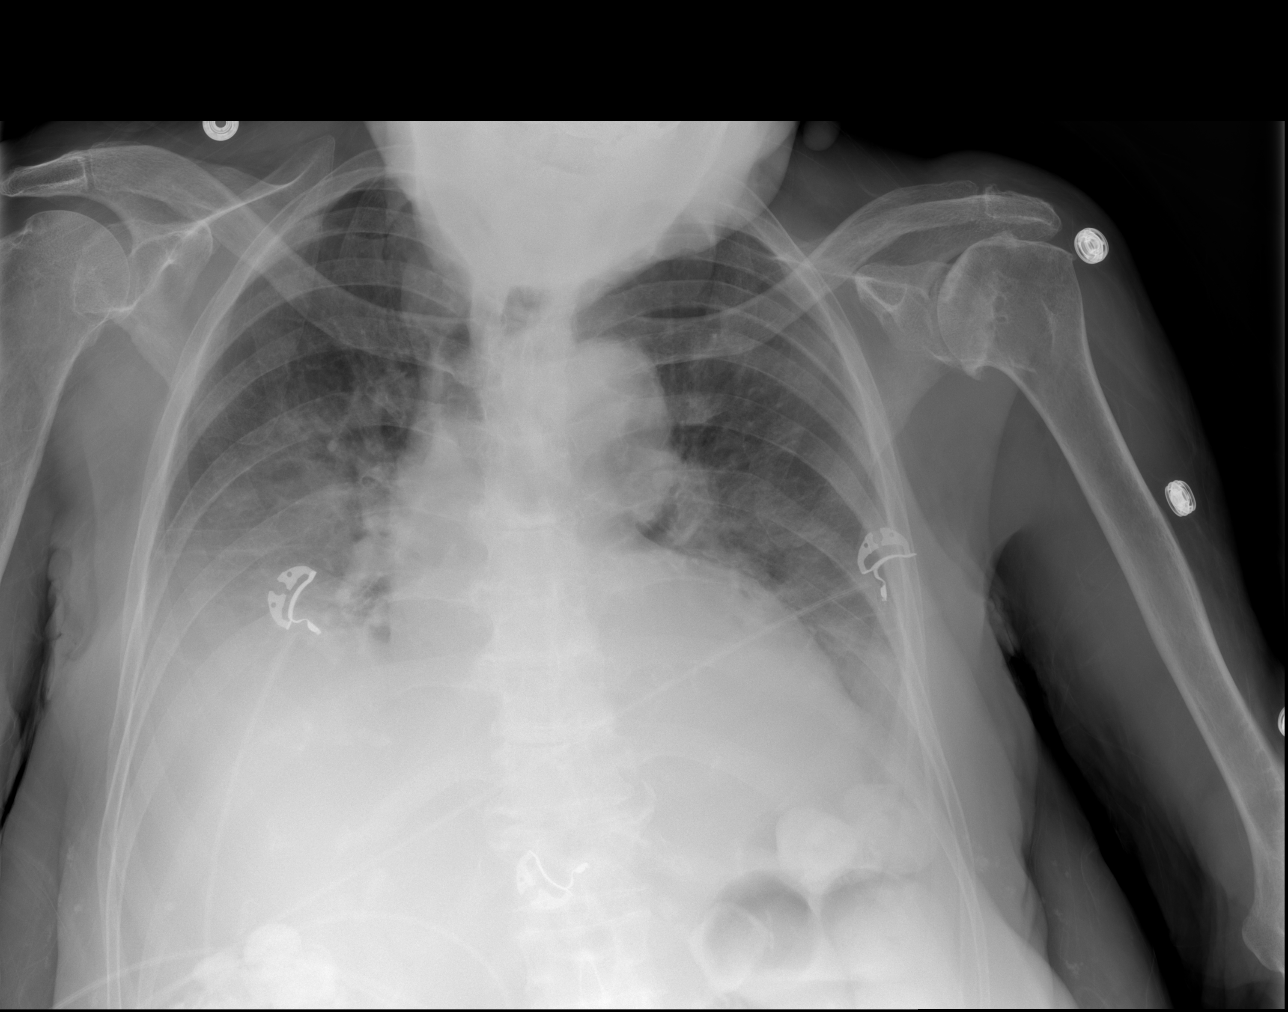

[3 of 3 positions shown; findings below may reference images not displayed]

FINDINGS: Bilateral central and basilar pulmonary opacity would low volumes.
Normal heart size.

No free intraperitoneal gas on the left decubitus image. Dilated
small bowel loops with air-fluid levels persist. Contrast is present
in the colon.
IMPRESSION: Stable partial small bowel obstruction pattern.

Bibasilar pulmonary opacity likely a combination of pleural fluid
and airspace disease.

## 2015-04-14 IMAGING — CR DG CHEST 1V PORT
1 series · 1 of 1 positions shown · non-contrast
Comparison: 06/14/2014.

CLINICAL DATA: Shortness of breath.

EXAM:
PORTABLE CHEST - 1 VIEW

[AP]
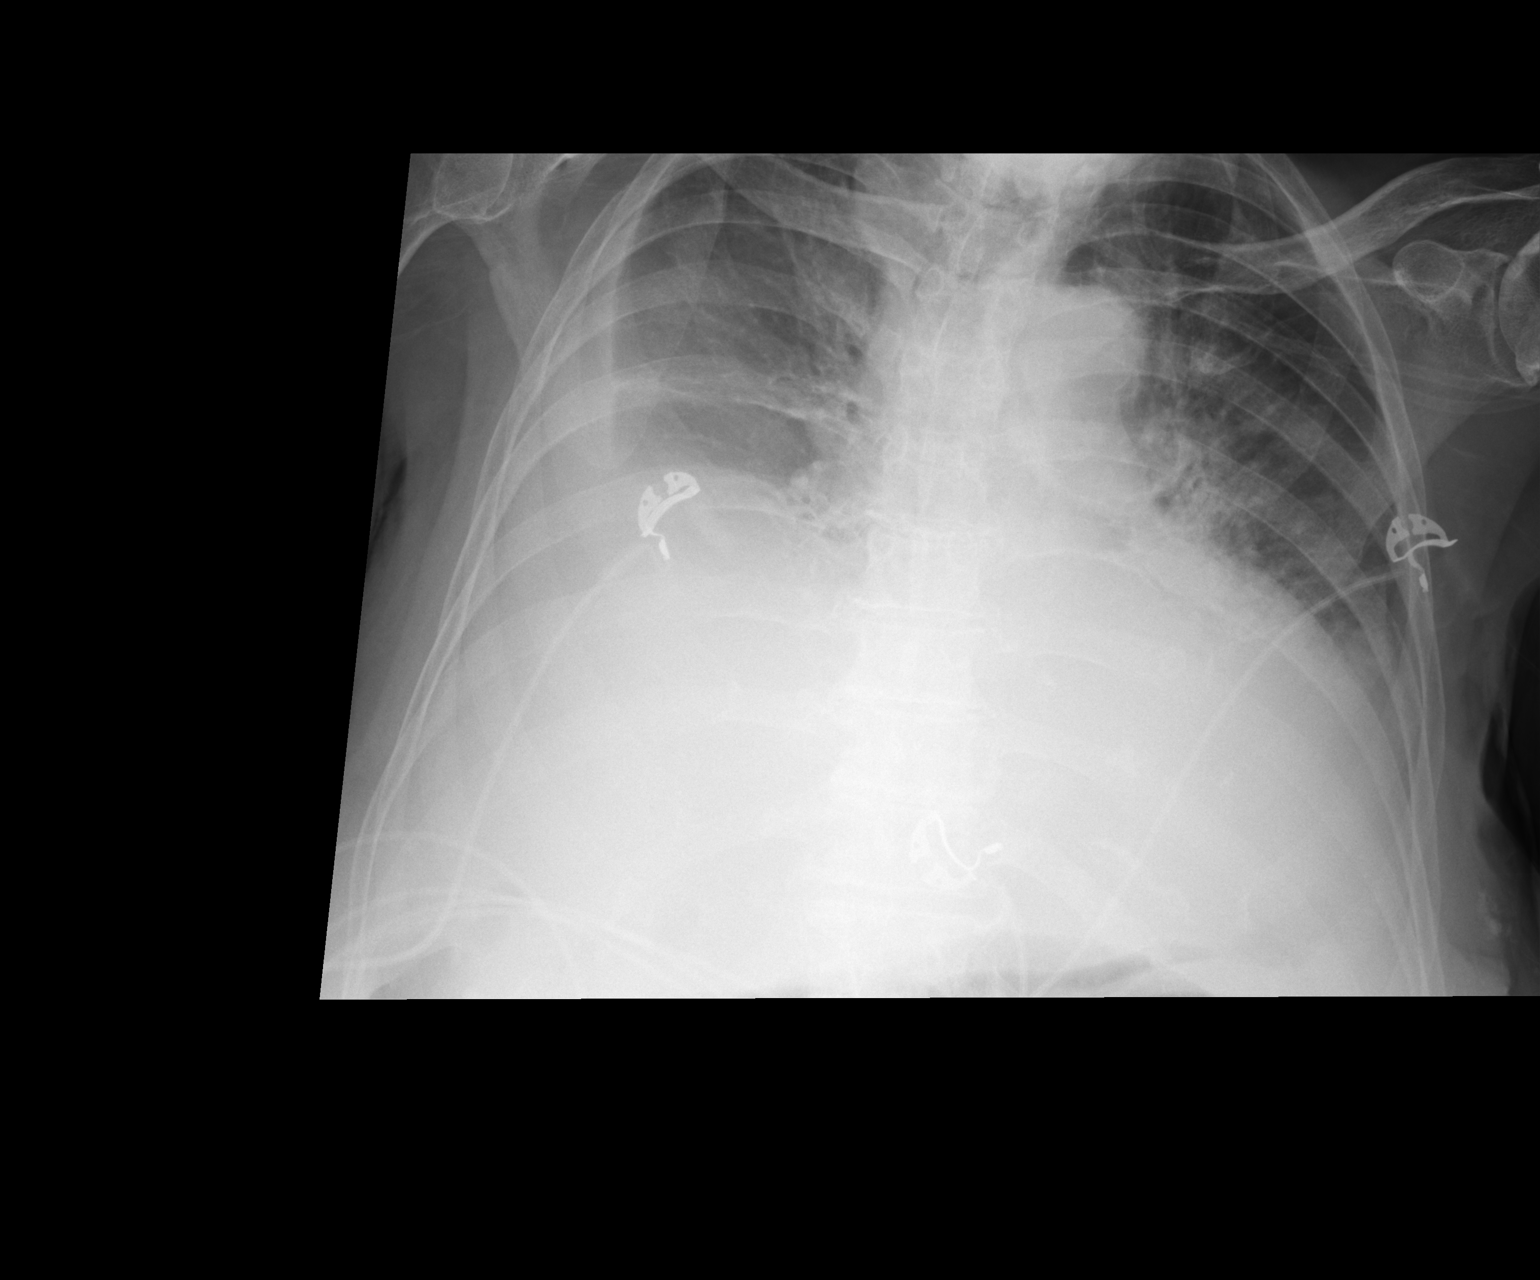

[1 of 1 positions shown; findings below may reference images not displayed]

FINDINGS: Mediastinum and hilar structures are unremarkable. Cardiomegaly.
Progressive bibasilar infiltrates and pleural effusions are noted.
These changes could be related to basilar pneumonia and/or
congestive heart failure with pulmonary edema. Prominent skin fold
on the left. No pneumothorax. No acute osseous abnormality.
IMPRESSION: 1. Progressive bibasilar pulmonary alveolar infiltrates and pleural
effusions. Pleural effusion on the right this prominent. These
findings have progressed from prior exam and could be infectious or
related to CHF.
2. Cardiomegaly.
